# Patient Record
Sex: Male | Born: 1960
Health system: Southern US, Community
[De-identification: ages and names within clinical notes are randomized; demographics above are authoritative.]

## PROBLEM LIST (undated history)

## (undated) DIAGNOSIS — D649 Anemia, unspecified: Secondary | ICD-10-CM

## (undated) DIAGNOSIS — E876 Hypokalemia: Secondary | ICD-10-CM

## (undated) DIAGNOSIS — D560 Alpha thalassemia: Secondary | ICD-10-CM

## (undated) DIAGNOSIS — K529 Noninfective gastroenteritis and colitis, unspecified: Secondary | ICD-10-CM

## (undated) DIAGNOSIS — R0789 Other chest pain: Secondary | ICD-10-CM

## (undated) HISTORY — DX: Alpha thalassemia: D56.0

## (undated) HISTORY — DX: Other chest pain: R07.89

## (undated) HISTORY — DX: Anemia, unspecified: D64.9

## (undated) HISTORY — DX: Hypokalemia: E87.6

---

## 1998-05-23 ENCOUNTER — Encounter: Payer: Self-pay | Admitting: Emergency Medicine

## 1998-05-23 ENCOUNTER — Emergency Department (HOSPITAL_COMMUNITY): Admission: EM | Admit: 1998-05-23 | Discharge: 1998-05-23 | Payer: Self-pay | Admitting: Emergency Medicine

## 2010-05-01 ENCOUNTER — Inpatient Hospital Stay (HOSPITAL_COMMUNITY)
Admission: EM | Admit: 2010-05-01 | Discharge: 2010-05-04 | Payer: Self-pay | Source: Home / Self Care | Attending: Internal Medicine | Admitting: Internal Medicine

## 2010-05-02 ENCOUNTER — Encounter: Payer: Self-pay | Admitting: Internal Medicine

## 2010-05-10 ENCOUNTER — Ambulatory Visit: Payer: Self-pay | Admitting: Internal Medicine

## 2010-05-10 DIAGNOSIS — D509 Iron deficiency anemia, unspecified: Secondary | ICD-10-CM | POA: Insufficient documentation

## 2010-05-10 DIAGNOSIS — E876 Hypokalemia: Secondary | ICD-10-CM | POA: Insufficient documentation

## 2010-05-10 LAB — CONVERTED CEMR LAB
Basophils Absolute: 0 10*3/uL (ref 0.0–0.1)
Basophils Relative: 0 % (ref 0–1)
Calcium: 9.8 mg/dL (ref 8.4–10.5)
Eosinophils Absolute: 0 10*3/uL (ref 0.0–0.7)
Eosinophils Relative: 1 % (ref 0–5)
HCT: 41.5 % (ref 39.0–52.0)
MCV: 75.7 fL — ABNORMAL LOW (ref 78.0–100.0)
Monocytes Absolute: 0.4 10*3/uL (ref 0.1–1.0)
Neutrophils Relative %: 69 % (ref 43–77)
Potassium: 4.5 meq/L (ref 3.5–5.3)
RBC: 5.48 M/uL (ref 4.22–5.81)
WBC: 6 10*3/uL (ref 4.0–10.5)
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
## 2010-06-28 NOTE — Progress Notes (Signed)
Addended by: Dorie Rank on: 06/28/2010 02:56 PM   Modules accepted: Orders

## 2010-07-11 ENCOUNTER — Ambulatory Visit (INDEPENDENT_AMBULATORY_CARE_PROVIDER_SITE_OTHER): Payer: 59 | Admitting: Internal Medicine

## 2010-07-11 ENCOUNTER — Encounter: Payer: Self-pay | Admitting: Internal Medicine

## 2010-07-11 VITALS — BP 109/69 | HR 75 | Temp 97.0°F | Wt 120.1 lb

## 2010-07-11 DIAGNOSIS — D649 Anemia, unspecified: Secondary | ICD-10-CM

## 2010-07-11 DIAGNOSIS — E876 Hypokalemia: Secondary | ICD-10-CM

## 2010-07-11 DIAGNOSIS — Z0000 Encounter for general adult medical examination without abnormal findings: Secondary | ICD-10-CM | POA: Insufficient documentation

## 2010-07-11 DIAGNOSIS — E559 Vitamin D deficiency, unspecified: Secondary | ICD-10-CM | POA: Insufficient documentation

## 2010-07-11 DIAGNOSIS — R1031 Right lower quadrant pain: Secondary | ICD-10-CM

## 2010-07-11 LAB — URINALYSIS
Bilirubin Urine: NEGATIVE
Ketones, ur: NEGATIVE mg/dL
Protein, ur: NEGATIVE mg/dL
Specific Gravity, Urine: 1.017 (ref 1.005–1.030)
Urobilinogen, UA: 0.2 mg/dL (ref 0.0–1.0)

## 2010-07-11 LAB — COMPREHENSIVE METABOLIC PANEL
ALT: 19 U/L (ref 0–53)
Alkaline Phosphatase: 66 U/L (ref 39–117)
CO2: 27 mEq/L (ref 19–32)
Calcium: 9.4 mg/dL (ref 8.4–10.5)
Chloride: 103 mEq/L (ref 96–112)
Creat: 0.76 mg/dL (ref 0.40–1.50)
Sodium: 139 mEq/L (ref 135–145)

## 2010-07-11 MED ORDER — OMEPRAZOLE 20 MG PO CPDR: 20.0000 mg | DELAYED_RELEASE_CAPSULE | Freq: Every day | ORAL | Status: DC

## 2010-07-11 NOTE — Patient Instructions (Addendum)
Please come back to the clinic in 2-4 weeks if your stomach pain does not get better. Call the clinic at [phone] if you have any questions or concerns.

## 2010-07-12 ENCOUNTER — Encounter: Payer: Self-pay | Admitting: Internal Medicine

## 2010-07-12 LAB — CBC
HCT: 41.3 % (ref 39.0–52.0)
MCV: 79.4 fL (ref 78.0–100.0)
RBC: 5.2 MIL/uL (ref 4.22–5.81)
WBC: 4.6 10*3/uL (ref 4.0–10.5)

## 2010-07-12 NOTE — Progress Notes (Signed)
  Subjective:    Patient ID: Kyle Cooke, male    DOB: 05-19-1961, 50 y.o.   MRN: [id number]  HPI Patient is a 50 y/o man w/ h/o anemia (Hb constant spring trait), fairly new to the clinic, who returns on follow up.  Today he complains of right lower quadrant abdominal pain that suddenly started this am. He states it started out as a sharp pain, cannot identify any triggers, and has been off and on all day, however resolving. States he's never had any prior episodes. Denies associated fever, chills, sweats, n/v/d/constipation, melena, hematochezia, dysuria, or hematuria. He states that he occassionally gets bloated after meals.   Review of Systems As per HPI above     Objective:   Physical Exam  Constitutional: He is oriented to person, place, and time. He appears well-developed and well-nourished. No distress.  Cardiovascular: Normal rate, regular rhythm and normal heart sounds.  Exam reveals no gallop and no friction rub.   No murmur heard. Pulmonary/Chest: Effort normal and breath sounds normal. No respiratory distress. He has no wheezes. He has no rales.  Abdominal: Soft. Bowel sounds are normal.       Mildly tender to palpation on RLQ with voluntary guarding. No CVA tenderness.  Musculoskeletal: Normal range of motion.  Neurological: He is alert and oriented to person, place, and time.  Psychiatric: He has a normal mood and affect.          Assessment & Plan:

## 2010-07-12 NOTE — Assessment & Plan Note (Signed)
2/2 poor po intake during December 2011 admission. Resolved after admission.

## 2010-07-12 NOTE — Assessment & Plan Note (Signed)
History is quite vague, etiology unclear at this point. Reports the pain is resolving. Given location, appendicitis and nephrolithiasis are possible, although very unlikely in the absence of fever and hematuria. Also denies h/o constipation, no associated fever, nausea, vomiting or diarrhea. Does report bloating after meals.  Will check UA to assess for hematuria, CBC to assess for white count and CMP. Will also start him on a PPI if this turns out to be GERD related.  Reassess in 2 weeks. If at that point this is unresolved, consider getting an abdominal U/S.

## 2010-07-12 NOTE — Assessment & Plan Note (Signed)
2/2 poor diet and found during his December 2011 admission. Was started on oral supplements. Will recheck levels today and adjust dose as necessary.

## 2010-07-12 NOTE — Assessment & Plan Note (Signed)
Found to be anemic during December 2011 admission, 2/2 to Hemoglobin H constant spring variant per Hb electrophoresis. Last CBC checked on hospital followup in late December 2011 showed Hb of 12.9, which perhaps is around his baseline. No interval h/o fatigue, weakness, or lightheadedness. Will recheck CBC today. Also refer to GI for screening colonoscopy.

## 2010-07-12 NOTE — Assessment & Plan Note (Signed)
Will put in referral to GI for colonoscopy.

## 2010-07-23 ENCOUNTER — Ambulatory Visit: Payer: 59 | Admitting: Internal Medicine

## 2010-08-06 LAB — LIPID PANEL
HDL: 30 mg/dL — ABNORMAL LOW (ref >39)
LDL Cholesterol: 73 mg/dL (ref 0–99)
Total CHOL/HDL Ratio: 4.2 RATIO
VLDL: 23 mg/dL (ref 0–40)

## 2010-08-06 LAB — COMPREHENSIVE METABOLIC PANEL
ALT: 30 U/L (ref 0–53)
AST: 35 U/L (ref 0–37)
Albumin: 4.4 g/dL (ref 3.5–5.2)
Alkaline Phosphatase: 74 U/L (ref 39–117)
Creatinine, Ser: 0.85 mg/dL (ref 0.4–1.5)
GFR calc Af Amer: >60 mL/min (ref >60)
GFR calc non Af Amer: >60 mL/min (ref >60)
Potassium: 3.4 mEq/L — ABNORMAL LOW (ref 3.5–5.1)
Total Bilirubin: 1.9 mg/dL — ABNORMAL HIGH (ref 0.3–1.2)

## 2010-08-06 LAB — CBC
HCT: 34.4 % — ABNORMAL LOW (ref 39.0–52.0)
HCT: 35.4 % — ABNORMAL LOW (ref 39.0–52.0)
Hemoglobin: 10.8 g/dL — ABNORMAL LOW (ref 13.0–17.0)
Hemoglobin: 11.1 g/dL — ABNORMAL LOW (ref 13.0–17.0)
MCH: 22.9 pg — ABNORMAL LOW (ref 26.0–34.0)
MCH: 23 pg — ABNORMAL LOW (ref 26.0–34.0)
MCH: 23 pg — ABNORMAL LOW (ref 26.0–34.0)
MCH: 23.1 pg — ABNORMAL LOW (ref 26.0–34.0)
MCHC: 31.1 g/dL (ref 30.0–36.0)
MCHC: 31.1 g/dL (ref 30.0–36.0)
MCHC: 31.4 g/dL (ref 30.0–36.0)
MCV: 73.7 fL — ABNORMAL LOW (ref 78.0–100.0)
Platelets: 132 10*3/uL — ABNORMAL LOW (ref 150–400)
RBC: 4.8 MIL/uL (ref 4.22–5.81)
RDW: 15.4 % (ref 11.5–15.5)
RDW: 15.8 % — ABNORMAL HIGH (ref 11.5–15.5)
RDW: 15.8 % — ABNORMAL HIGH (ref 11.5–15.5)
WBC: 5.1 10*3/uL (ref 4.0–10.5)
WBC: 6.3 10*3/uL (ref 4.0–10.5)

## 2010-08-06 LAB — DIFFERENTIAL
Eosinophils Relative: 0 % (ref 0–5)
Lymphs Abs: 0.8 10*3/uL (ref 0.7–4.0)
Monocytes Absolute: 0.2 10*3/uL (ref 0.1–1.0)
Monocytes Relative: 4 % (ref 3–12)
Neutrophils Relative %: 83 % — ABNORMAL HIGH (ref 43–77)

## 2010-08-06 LAB — LEGIONELLA ANTIGEN, URINE

## 2010-08-06 LAB — CK: Total CK: 173 U/L (ref 7–232)

## 2010-08-06 LAB — RETICULOCYTES
RBC.: 4.83 MIL/uL (ref 4.22–5.81)
Retic Count, Absolute: 217.4 10*3/uL — ABNORMAL HIGH (ref 19.0–186.0)
Retic Ct Pct: 4.5 % — ABNORMAL HIGH (ref 0.4–3.1)

## 2010-08-06 LAB — VITAMIN B12: Vitamin B-12: 539 pg/mL (ref 211–911)

## 2010-08-06 LAB — BASIC METABOLIC PANEL
BUN: 6 mg/dL (ref 6–23)
BUN: 7 mg/dL (ref 6–23)
CO2: 24 mEq/L (ref 19–32)
CO2: 25 mEq/L (ref 19–32)
CO2: 26 mEq/L (ref 19–32)
Calcium: 8.7 mg/dL (ref 8.4–10.5)
Chloride: 108 mEq/L (ref 96–112)
Creatinine, Ser: 0.81 mg/dL (ref 0.4–1.5)
Creatinine, Ser: 0.83 mg/dL (ref 0.4–1.5)
GFR calc Af Amer: >60 mL/min (ref >60)
GFR calc non Af Amer: >60 mL/min (ref >60)
Glucose, Bld: 75 mg/dL (ref 70–99)
Glucose, Bld: 76 mg/dL (ref 70–99)
Potassium: 3.3 mEq/L — ABNORMAL LOW (ref 3.5–5.1)
Potassium: 3.6 mEq/L (ref 3.5–5.1)
Sodium: 139 mEq/L (ref 135–145)
Sodium: 141 mEq/L (ref 135–145)

## 2010-08-06 LAB — HEPATIC FUNCTION PANEL
AST: 36 U/L (ref 0–37)
Albumin: 3.9 g/dL (ref 3.5–5.2)
Alkaline Phosphatase: 60 U/L (ref 39–117)
Bilirubin, Direct: 0.2 mg/dL (ref 0.0–0.3)
Total Bilirubin: 2.1 mg/dL — ABNORMAL HIGH (ref 0.3–1.2)

## 2010-08-06 LAB — HGB ELECTROPHORESIS REFLEXED REPORT
Hemoglobin A - HGBRFX: 94.8 % — ABNORMAL LOW (ref >96.0)
Hemoglobin A2 - HGBRFX: 1 % — ABNORMAL LOW (ref 1.8–3.5)
Hemoglobin F - HGBRFX: 1.7 % (ref <2.0)
Sickle Solubility Test - HGBRFX: UNDETERMINED

## 2010-08-06 LAB — RAPID URINE DRUG SCREEN, HOSP PERFORMED
Barbiturates: NOT DETECTED
Benzodiazepines: NOT DETECTED
Tetrahydrocannabinol: NOT DETECTED

## 2010-08-06 LAB — IRON AND TIBC
Iron: 140 ug/dL — ABNORMAL HIGH (ref 42–135)
TIBC: 216 ug/dL (ref 215–435)

## 2010-08-06 LAB — URINALYSIS, ROUTINE W REFLEX MICROSCOPIC
Glucose, UA: NEGATIVE mg/dL
Hgb urine dipstick: NEGATIVE
Ketones, ur: NEGATIVE mg/dL
Urobilinogen, UA: 0.2 mg/dL (ref 0.0–1.0)
pH: 7 (ref 5.0–8.0)

## 2010-08-06 LAB — HEMOGLOBINOPATHY EVALUATION
Hemoglobin Other: 4.7 % — ABNORMAL HIGH (ref 0.0–0.0)
Hgb A2 Quant: 1.5 % — ABNORMAL LOW (ref 2.2–3.2)
Hgb A: 92.1 % — ABNORMAL LOW (ref 96.8–97.8)

## 2010-08-06 LAB — CK TOTAL AND CKMB (NOT AT ARMC): Total CK: 215 U/L (ref 7–232)

## 2010-08-06 LAB — HEPATITIS PANEL, ACUTE
HCV Ab: NEGATIVE
HCV Ab: NEGATIVE
Hep A IgM: NEGATIVE
Hep A IgM: NEGATIVE
Hep B C IgM: NEGATIVE

## 2010-08-06 LAB — PROTIME-INR: INR: 1.06 (ref 0.00–1.49)

## 2010-08-06 LAB — LACTATE DEHYDROGENASE: LDH: 117 U/L (ref 94–250)

## 2010-08-06 LAB — POCT CARDIAC MARKERS

## 2010-08-06 LAB — EPSTEIN-BARR VIRUS VCA ANTIBODY PANEL
EBV EA IgG: 0.88 {ISR}
EBV VCA IgG: 2.81 {ISR} — ABNORMAL HIGH

## 2010-08-06 LAB — HEAVY METALS, BLOOD: Lead: <2 ug/dL (ref <10)

## 2010-08-06 LAB — TROPONIN I: Troponin I: 0.01 ng/mL (ref 0.00–0.06)

## 2010-08-06 LAB — ETHANOL: Alcohol, Ethyl (B): <5 mg/dL (ref 0–10)

## 2010-08-06 LAB — SEDIMENTATION RATE: Sed Rate: 0 mm/hr (ref 0–16)

## 2010-08-06 LAB — HAPTOGLOBIN: Haptoglobin: 15 mg/dL — ABNORMAL LOW (ref 16–200)

## 2010-08-06 LAB — APTT: aPTT: 30 seconds (ref 24–37)

## 2010-12-06 ENCOUNTER — Encounter: Payer: Self-pay | Admitting: Internal Medicine

## 2012-02-12 ENCOUNTER — Other Ambulatory Visit: Payer: Self-pay | Admitting: Internal Medicine

## 2012-07-10 ENCOUNTER — Ambulatory Visit (INDEPENDENT_AMBULATORY_CARE_PROVIDER_SITE_OTHER): Payer: 59 | Admitting: Family Medicine

## 2012-07-10 VITALS — BP 144/82 | HR 74 | Temp 98.1°F | Resp 18 | Ht 60.0 in | Wt 118.2 lb

## 2012-07-10 DIAGNOSIS — H109 Unspecified conjunctivitis: Secondary | ICD-10-CM

## 2012-07-10 DIAGNOSIS — H5712 Ocular pain, left eye: Secondary | ICD-10-CM

## 2012-07-10 DIAGNOSIS — H5710 Ocular pain, unspecified eye: Secondary | ICD-10-CM

## 2012-07-10 MED ORDER — OFLOXACIN 0.3 % OP SOLN: 2.0000 [drp] | Freq: Four times a day (QID) | OPHTHALMIC | Status: DC

## 2012-07-10 NOTE — Progress Notes (Signed)
[facility]:  Office Visit  Chief Complaint:  Chief Complaint  Patient presents with  . Eye Problem    x 1 year  left eye    HPI: Kyle Cooke is a 52 y.o. male who complains of  Left eye problem x 1 year. About 3 days ago he started having some irritation, pain behind eyes and now it is red and there is some bleeding. 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 No photophobia, no vision loss.   He had an operation on his left eye on [facility]. He does not know who his doctor is.   Past Medical History  Diagnosis Date  . Hypokalemia   . Atypical chest pain      Noncardiac in etiology with negative cardiac enzymes 2-D echo and EKG during December 2011 admission  . Anemia   . Hemoglobin H constant spring variant   . Vitamin D deficiency    No past surgical history on file. History   Social History  . Marital Status: Married    Spouse Name: N/A    Number of Children: N/A  . Years of Education: N/A   Occupational History  . employed    Social History Main Topics  . Smoking status: Former Smoker    Quit date: 07/11/1996  . Smokeless tobacco: None  . Alcohol Use: No  . Drug Use: No  . Sexually Active: None   Other Topics Concern  . None   Social History Narrative   Pt. is from [place]; speaks some [name]. Currently employed. Denies smoking, alcohol or illicit drug use.Smoking Status:  never   No family history on file. No Known Allergies Prior to Admission medications   Medication Sig Start Date End Date Taking? Authorizing Provider  aspirin 81 MG chewable tablet Chew 81 mg by mouth daily.      Historical Provider, MD  b complex vitamins tablet Take 1 tablet by mouth daily.      Historical Provider, MD  ergocalciferol (VITAMIN D2) 50000 UNIT capsule Take 50,000 Units by mouth once a week.      Historical Provider, MD     ROS: The patient denies fevers, chills, night sweats, unintentional weight loss, chest pain, palpitations, wheezing, dyspnea on exertion, nausea, vomiting,  abdominal pain, dysuria, hematuria, melena, numbness, weakness, or tingling.   All other systems have been reviewed and were otherwise negative with the exception of those mentioned in the HPI and as above.    PHYSICAL EXAM: Filed Vitals:   07/10/12 0958  BP: 144/82  Pulse: 74  Temp: 98.1 F (36.7 C)  Resp: 18   Filed Vitals:   07/10/12 0958  Height: 5' (1.524 m)  Weight: 118 lb 3.2 oz (53.615 kg)   Body mass index is 23.08 kg/(m^2).  General: Alert, no acute distress HEENT:  Normocephalic, atraumatic, oropharynx patent. EOMI. PERRLA. Fundoscopic exam nl. + left eye has medial sceleral icterus. + fluoroscein exam has mild uptake on left medial sclera. No cellulitis.  Cardiovascular:  Regular rate and rhythm, no rubs murmurs or gallops.  No Carotid bruits, radial pulse intact. No pedal edema.  Respiratory: Clear to auscultation bilaterally.  No wheezes, rales, or rhonchi.  No cyanosis, no use of accessory musculature GI: No organomegaly, abdomen is soft and non-tender, positive bowel sounds.  No masses. Skin: No rashes. Neurologic: Facial musculature symmetric. Psychiatric: Patient is appropriate throughout our interaction. Lymphatic: No cervical lymphadenopathy Musculoskeletal: Gait intact.   LABS: Results for orders placed in visit on 07/11/10  COMPREHENSIVE METABOLIC PANEL      Result Value Range   Sodium 139  135 - 145 mEq/L   Potassium 4.0  3.5 - 5.3 mEq/L   Chloride 103  96 - 112 mEq/L   CO2 27  19 - 32 mEq/L   Glucose, Bld 83  70 - 99 mg/dL   BUN 14  6 - 23 mg/dL   Creat 1.61  0.96 - 0.45 mg/dL   Total Bilirubin 1.9 (*) 0.3 - 1.2 mg/dL   Alkaline Phosphatase 66  39 - 117 U/L   AST 23  0 - 37 U/L   ALT 19  0 - 53 U/L   Total Protein 7.3  6.0 - 8.3 g/dL   Albumin 4.5  3.5 - 5.2 g/dL   Calcium 9.4  8.4 - 40.9 mg/dL  CBC      Result Value Range   WBC 4.6  4.0 - 10.5 K/uL   RBC 5.20  4.22 - 5.81 MIL/uL   Hemoglobin 12.1 (*) 13.0 - 17.0 g/dL   HCT 81.1  91.4 -  78.2 %   MCV 79.4  78.0 - 100.0 fL   MCH 23.3 (*) 26.0 - 34.0 pg   MCHC 29.3 (*) 30.0 - 36.0 g/dL   RDW 95.6 (*) 21.3 - 08.6 %   Platelets 168  150 - 400 K/uL  URINALYSIS      Result Value Range   Color, Urine YELLOW  YELLOW   APPearance CLEAR  CLEAR   Specific Gravity, Urine 1.017  1.005 - 1.030   pH 7.0  5.0 - 8.0   Urine Glucose, Fasting NEG  NEG mg/dL   Bilirubin Urine NEG  NEG   Ketones, ur NEG  NEG mg/dL   Hgb urine dipstick NEG  NEG   Protein, ur NEG  NEG mg/dL   Urobilinogen, UA 0.2  0.0 - 1.0 mg/dL   Nitrite NEG  NEG   Leukocytes, UA NEG  NEG  VITAMIN D 1,25 DIHYDROXY      Result Value Range   Vitamin D 1, 25 (OH) Total 62  18 - 72 pg/mL   Vitamin D3 1, 25 (OH) 11     Vitamin D2 1, 25 (OH) 51       EKG/XRAY:   Primary read interpreted by Dr. Conley Rolls at [facility].   ASSESSMENT/PLAN: Encounter Diagnosis  Name Primary?  . Eye pain, left Yes   52 y/o [place] male with a h/o left eye pain x 1 year s/p eye surgery by ? MD on [address]. He is here with an acute case of left eye irritation, pain, and conjunctival erythema for the last 3 days. 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  Language barrier.Fluoriscein  exam has sclight sceral uptake , no corneal ulcers .  F/u in the AM Rx Ocuflox.    Shelby Anderle PHUONG, DO 07/10/2012 10:46 AM

## 2012-07-11 ENCOUNTER — Ambulatory Visit (INDEPENDENT_AMBULATORY_CARE_PROVIDER_SITE_OTHER): Payer: 59 | Admitting: Family Medicine

## 2012-07-11 VITALS — BP 150/70 | HR 90 | Temp 98.6°F | Resp 12 | Ht 61.0 in | Wt 119.0 lb

## 2012-07-11 DIAGNOSIS — H189 Unspecified disorder of cornea: Secondary | ICD-10-CM

## 2012-07-11 DIAGNOSIS — S0500XA Injury of conjunctiva and corneal abrasion without foreign body, unspecified eye, initial encounter: Secondary | ICD-10-CM

## 2012-07-11 DIAGNOSIS — S0510XA Contusion of eyeball and orbital tissues, unspecified eye, initial encounter: Secondary | ICD-10-CM

## 2012-07-11 NOTE — Progress Notes (Signed)
[facility]:  Office Visit  Chief Complaint:  Chief Complaint  Patient presents with  . Eye Pain    HPI: Kyle Cooke is a 52 y.o. male who is here for a recheck of his eye. There is a language barrier. He is here with his son who speaks [name] but he is not helpful in translating either.  He was evaluated by me for left eye redness and pain yesterday, from what he told me and what I was able to understand he was at home and started having pain in his left eye and also redness. NKI. However he also states that he has had this problem off and on for about 1 year. 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 While in the office he called his supervisor Kyle Cooke. Kyle Cooke tells me that his eye issues has been going on for several months, and she told him to go home on Saturday morning and see a doctor because he was having minimal blood draining from his left eye. So he came into the office, I did a fluoroscein  exam, there was a small uptake at the left  medial sclera that appears to be an abrasion where I believe he also has a pterygium. No corneal ulcers were seen. Today he is better but not 100%. He sees [name]. He has had an operation on his left eye by [name] but he does not know what it is. Denies vision changes, photophobia, worsening pain.   Past Medical History  Diagnosis Date  . Hypokalemia   . Atypical chest pain      Noncardiac in etiology with negative cardiac enzymes 2-D echo and EKG during December 2011 admission  . Anemia   . Hemoglobin H constant spring variant   . Vitamin D deficiency    No past surgical history on file. History   Social History  . Marital Status: Married    Spouse Name: N/A    Number of Children: N/A  . Years of Education: N/A   Occupational History  . employed    Social History Main Topics  . Smoking status: Former Smoker    Quit date: 07/11/1996  . Smokeless tobacco: None  .  Alcohol Use: No  . Drug Use: No  . Sexually Active: None   Other Topics Concern  . None   Social History Narrative   Pt. is from [place]; speaks some [name]. Currently employed. Denies smoking, alcohol or illicit drug use.Smoking Status:  never   No family history on file. No Known Allergies Prior to Admission medications   Medication Sig Start Date End Date Taking? Authorizing Provider  ofloxacin (OCUFLOX) 0.3 % ophthalmic solution Place 2 drops into the left eye 4 (four) times daily. 07/10/12  Yes [name], DO  aspirin 81 MG chewable tablet Chew 81 mg by mouth daily.      Historical Provider, MD  b complex vitamins tablet Take 1 tablet by mouth daily.      Historical Provider, MD  ergocalciferol (VITAMIN D2) 50000 UNIT capsule Take 50,000 Units by mouth once a week.      Historical Provider, MD     ROS: The patient denies fevers, chills, night sweats, unintentional weight loss, chest pain, palpitations, wheezing, dyspnea on exertion, nausea, vomiting, abdominal pain, dysuria, hematuria, melena, numbness, weakness, or tingling.  All other systems have been reviewed and were otherwise negative with the exception of those mentioned  in the HPI and as above.    PHYSICAL EXAM: Filed Vitals:   07/11/12 1517  BP: 150/70  Pulse: 90  Temp: 98.6 F (37 C)  Resp: 12   Filed Vitals:   07/11/12 1517  Height: 5\' 1"  (1.549 m)  Weight: 119 lb (53.978 kg)   Body mass index is 22.5 kg/(m^2).  General: Alert, no acute distress HEENT:  Normocephalic, atraumatic, oropharynx patent. EOMI, PERRLA. Still red conjunctiva, + pterygium left eye Cardiovascular:  Regular rate and rhythm, no rubs murmurs or gallops.  No Carotid bruits, radial pulse intact. No pedal edema.  Respiratory: Clear to auscultation bilaterally.  No wheezes, rales, or rhonchi.  No cyanosis, no use of accessory musculature GI: No organomegaly, abdomen is soft and non-tender, positive bowel sounds.  No masses. Skin: No  rashes. Neurologic: Facial musculature symmetric. Psychiatric: Patient is appropriate throughout our interaction. Lymphatic: No cervical lymphadenopathy Musculoskeletal: Gait intact.   LABS:    EKG/XRAY:   Primary read interpreted by Dr. Conley Rolls at [facility].   ASSESSMENT/PLAN: Encounter Diagnoses  Name Primary?  . Corneal abnormality Yes  . Corneal abrasion    Will refer to [name] since this has been a chronic problem and he has had an unknown eye surgery ins ame eye? Cataracts Can go back to work with eye protection C/w abx eye drops F/u prn   [name], DO 07/12/2012 5:15 PM

## 2012-12-01 ENCOUNTER — Encounter (HOSPITAL_COMMUNITY): Payer: Self-pay | Admitting: Cardiology

## 2012-12-01 ENCOUNTER — Encounter (HOSPITAL_COMMUNITY): Payer: Self-pay | Admitting: *Deleted

## 2012-12-01 ENCOUNTER — Emergency Department (HOSPITAL_COMMUNITY)
Admission: EM | Admit: 2012-12-01 | Discharge: 2012-12-01 | Disposition: A | Discharge status: Home / Self Care | Payer: 59 | Attending: Emergency Medicine | Admitting: Emergency Medicine

## 2012-12-01 ENCOUNTER — Emergency Department (HOSPITAL_COMMUNITY)
Admission: EM | Admit: 2012-12-01 | Discharge: 2012-12-01 | Disposition: A | Discharge status: Nursing Home (Includes ICF) | Payer: 59 | Source: Home / Self Care | Attending: Family Medicine | Admitting: Family Medicine

## 2012-12-01 DIAGNOSIS — Z87891 Personal history of nicotine dependence: Secondary | ICD-10-CM | POA: Insufficient documentation

## 2012-12-01 DIAGNOSIS — Y929 Unspecified place or not applicable: Secondary | ICD-10-CM | POA: Insufficient documentation

## 2012-12-01 DIAGNOSIS — T2020XA Burn of second degree of head, face, and neck, unspecified site, initial encounter: Secondary | ICD-10-CM | POA: Insufficient documentation

## 2012-12-01 DIAGNOSIS — E559 Vitamin D deficiency, unspecified: Secondary | ICD-10-CM | POA: Insufficient documentation

## 2012-12-01 DIAGNOSIS — Y939 Activity, unspecified: Secondary | ICD-10-CM | POA: Insufficient documentation

## 2012-12-01 DIAGNOSIS — Z79899 Other long term (current) drug therapy: Secondary | ICD-10-CM | POA: Insufficient documentation

## 2012-12-01 DIAGNOSIS — T310 Burns involving less than 10% of body surface: Secondary | ICD-10-CM

## 2012-12-01 DIAGNOSIS — Z8639 Personal history of other endocrine, nutritional and metabolic disease: Secondary | ICD-10-CM | POA: Insufficient documentation

## 2012-12-01 DIAGNOSIS — X12XXXA Contact with other hot fluids, initial encounter: Secondary | ICD-10-CM | POA: Insufficient documentation

## 2012-12-01 DIAGNOSIS — Z862 Personal history of diseases of the blood and blood-forming organs and certain disorders involving the immune mechanism: Secondary | ICD-10-CM | POA: Insufficient documentation

## 2012-12-01 MED ORDER — HYDROMORPHONE HCL PF 1 MG/ML IJ SOLN
INTRAMUSCULAR | Status: AC
  Filled 2012-12-01: qty 1

## 2012-12-01 MED ORDER — TETANUS-DIPHTH-ACELL PERTUSSIS 5-2.5-18.5 LF-MCG/0.5 IM SUSP
0.5000 mL | Freq: Once | INTRAMUSCULAR | Status: AC
  Administered 2012-12-01: 0.5 mL via INTRAMUSCULAR

## 2012-12-01 MED ORDER — SILVER SULFADIAZINE 1 % EX CREA
TOPICAL_CREAM | Freq: Once | CUTANEOUS | Status: AC
  Administered 2012-12-01: 20:00:00 via TOPICAL
  Filled 2012-12-01: qty 85

## 2012-12-01 MED ORDER — TETANUS-DIPHTH-ACELL PERTUSSIS 5-2.5-18.5 LF-MCG/0.5 IM SUSP
INTRAMUSCULAR | Status: AC
  Filled 2012-12-01: qty 0.5

## 2012-12-01 MED ORDER — HYDROCODONE-ACETAMINOPHEN 5-325 MG PO TABS: 1.0000 | ORAL_TABLET | Freq: Four times a day (QID) | ORAL | Status: DC | PRN

## 2012-12-01 MED ORDER — SODIUM CHLORIDE 0.9 % IV SOLN
Freq: Once | INTRAVENOUS | Status: AC
  Administered 2012-12-01: 18:00:00 via INTRAVENOUS

## 2012-12-01 MED ORDER — HYDROCODONE-ACETAMINOPHEN 5-325 MG PO TABS
1.0000 | ORAL_TABLET | Freq: Once | ORAL | Status: AC
  Administered 2012-12-01: 1 via ORAL
  Filled 2012-12-01: qty 1

## 2012-12-01 MED ORDER — HYDROMORPHONE HCL 1 MG/ML IJ SOLN
1.0000 mg | Freq: Once | INTRAMUSCULAR | Status: AC
  Administered 2012-12-01: 1 mg via INTRAVENOUS
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
## 2012-12-01 NOTE — ED Notes (Signed)
Pt was given 1 mg of dilaudid at urgent care center.
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
## 2012-12-01 NOTE — ED Notes (Signed)
[facility] here and at the pt's BS at this time receiving report.
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
## 2012-12-30 ENCOUNTER — Encounter (HOSPITAL_COMMUNITY): Payer: Self-pay | Admitting: *Deleted

## 2012-12-30 ENCOUNTER — Emergency Department (HOSPITAL_COMMUNITY): Payer: 59

## 2012-12-30 ENCOUNTER — Emergency Department (HOSPITAL_COMMUNITY)
Admission: EM | Admit: 2012-12-30 | Discharge: 2012-12-30 | Disposition: A | Discharge status: Home / Self Care | Payer: 59 | Attending: Emergency Medicine | Admitting: Emergency Medicine

## 2012-12-30 DIAGNOSIS — Z862 Personal history of diseases of the blood and blood-forming organs and certain disorders involving the immune mechanism: Secondary | ICD-10-CM | POA: Insufficient documentation

## 2012-12-30 DIAGNOSIS — R51 Headache: Secondary | ICD-10-CM | POA: Insufficient documentation

## 2012-12-30 DIAGNOSIS — R109 Unspecified abdominal pain: Secondary | ICD-10-CM | POA: Insufficient documentation

## 2012-12-30 DIAGNOSIS — K219 Gastro-esophageal reflux disease without esophagitis: Secondary | ICD-10-CM | POA: Insufficient documentation

## 2012-12-30 DIAGNOSIS — M549 Dorsalgia, unspecified: Secondary | ICD-10-CM | POA: Insufficient documentation

## 2012-12-30 DIAGNOSIS — Z8639 Personal history of other endocrine, nutritional and metabolic disease: Secondary | ICD-10-CM | POA: Insufficient documentation

## 2012-12-30 DIAGNOSIS — R42 Dizziness and giddiness: Secondary | ICD-10-CM | POA: Insufficient documentation

## 2012-12-30 DIAGNOSIS — R110 Nausea: Secondary | ICD-10-CM | POA: Insufficient documentation

## 2012-12-30 DIAGNOSIS — Z8679 Personal history of other diseases of the circulatory system: Secondary | ICD-10-CM | POA: Insufficient documentation

## 2012-12-30 DIAGNOSIS — Z87891 Personal history of nicotine dependence: Secondary | ICD-10-CM | POA: Insufficient documentation

## 2012-12-30 LAB — CBC WITH DIFFERENTIAL/PLATELET
Eosinophils Absolute: 0 10*3/uL (ref 0.0–0.7)
Eosinophils Relative: 0 % (ref 0–5)
Hemoglobin: 12.3 g/dL — ABNORMAL LOW (ref 13.0–17.0)
Lymphs Abs: 0.8 10*3/uL (ref 0.7–4.0)
MCH: 23.5 pg — ABNORMAL LOW (ref 26.0–34.0)
MCHC: 31.9 g/dL (ref 30.0–36.0)
MCV: 73.6 fL — ABNORMAL LOW (ref 78.0–100.0)
Monocytes Relative: 6 % (ref 3–12)
RBC: 5.23 MIL/uL (ref 4.22–5.81)

## 2012-12-30 LAB — URINALYSIS, ROUTINE W REFLEX MICROSCOPIC
Bilirubin Urine: NEGATIVE
Hgb urine dipstick: NEGATIVE
Ketones, ur: NEGATIVE mg/dL
Specific Gravity, Urine: 1.023 (ref 1.005–1.030)
Urobilinogen, UA: 0.2 mg/dL (ref 0.0–1.0)
pH: 5.5 (ref 5.0–8.0)

## 2012-12-30 LAB — COMPREHENSIVE METABOLIC PANEL
ALT: 20 U/L (ref 0–53)
BUN: 17 mg/dL (ref 6–23)
CO2: 27 mEq/L (ref 19–32)
Calcium: 9.4 mg/dL (ref 8.4–10.5)
Creatinine, Ser: 0.9 mg/dL (ref 0.50–1.35)
GFR calc Af Amer: >90 mL/min (ref >90)
GFR calc non Af Amer: >90 mL/min (ref >90)
Glucose, Bld: 89 mg/dL (ref 70–99)

## 2012-12-30 MED ORDER — PANTOPRAZOLE SODIUM 20 MG PO TBEC: 20.0000 mg | DELAYED_RELEASE_TABLET | Freq: Every day | ORAL | Status: DC

## 2012-12-30 MED ORDER — MORPHINE SULFATE 4 MG/ML IJ SOLN
4.0000 mg | Freq: Once | INTRAMUSCULAR | Status: AC
  Administered 2012-12-30: 4 mg via INTRAVENOUS
  Filled 2012-12-30: qty 1

## 2012-12-30 MED ORDER — ONDANSETRON HCL 4 MG/2ML IJ SOLN
4.0000 mg | Freq: Once | INTRAMUSCULAR | Status: AC
  Administered 2012-12-30: 4 mg via INTRAVENOUS
  Filled 2012-12-30: qty 2

## 2012-12-30 MED ORDER — SODIUM CHLORIDE 0.9 % IV BOLUS (SEPSIS)
1000.0000 mL | Freq: Once | INTRAVENOUS | Status: AC
  Administered 2012-12-30: 1000 mL via INTRAVENOUS

## 2012-12-30 NOTE — ED Notes (Signed)
Patient transported to X-ray 

## 2012-12-30 NOTE — ED Notes (Addendum)
Reports increased pain after eating or drinking. States has been eating & drinking normal. Pain has been constant since onset

## 2012-12-30 NOTE — ED Notes (Addendum)
C/o upper abd pain radiating through to back since Tuesday. Denies n/v/d.

## 2012-12-30 NOTE — ED Notes (Signed)
Pt undressed and in gown, pt placed on continuous pulse oximetry and blood pressure cuff; family at bedside

## 2012-12-30 NOTE — ED Provider Notes (Signed)
CSN: [id number]     Arrival date & time 12/30/12  6962 History     First MD Initiated Contact with Patient 12/30/12 [phone]     Chief Complaint  Patient presents with  . Abdominal Pain  . Back pain . Headaches. (Consider location/radiation/quality/duration/timing/severity/associated sxs/prior Treatment) HPI 52 y o [place] male, with hx of anemia (Hb constant spring trait), vit D deficiency. Presented with C/o of Abdominal pain of 2 day duration, mostly epigastric region, but also on the right side of his abdomen,9/10 intensity, descibed as hunger pangs, and knives going thro his abd, Radiates to his back, aggrav by food and lying back, says the sensations goes upwards into his chest, no relieving factors. Has nausea but no vomiting, no change in bowel habits or appetite, no hematochezia, no weight loss, no previous epidoses of abd pain, no dysuria or freq, no fever. Pt also complains of back pain, but no weakness or shooting pains to his legs, no tingling sensation, no hx of falls or trauma, pt denies alcohol intake, use of drugs or smoking cig. Headaches started this  Morning, generalized, with assoc dizziness.      Past Medical History  Diagnosis Date  . Hypokalemia   . Atypical chest pain      Noncardiac in etiology with negative cardiac enzymes 2-D echo and EKG during December 2011 admission  . Anemia   . Hemoglobin H constant spring variant   . Vitamin D deficiency    History reviewed. No pertinent past surgical history. No family history on file. History  Substance Use Topics  . Smoking status: Former Smoker    Quit date: 07/11/1996  . Smokeless tobacco: Not on file  . Alcohol Use: No    Review of Systems No pertinent findings on ROS.  Allergies  Review of patient's allergies indicates no known allergies.  Home Medications   Current Outpatient Rx  Name  Route  Sig  Dispense  Refill  . pantoprazole (PROTONIX) 20 MG tablet   Oral   Take 1 tablet (20 mg total) by  mouth daily.   60 tablet   0    BP 105/70  Pulse 58  Temp(Src) 98.1 F (36.7 C) (Oral)  Resp 17  SpO2 99% Physical Exam Gen- alert, co- op, not in any obvious distress, a bit of a challenge understanding px, Niece present to help with interpretation. HEENT- Atraumatic, normocephalic, PERRL, EOMI,, Sclera appears clear, mucosa- moist, no ulcers.. Cardiac- RRR, no murmurs, rubs or gallops. Resp- Clear to auscultation bilat. Abd- Soft, moves with respiration, tenderness RUQ, , bowel sounds present, no masses or organomegaly. Back- normal spinal curvature,tenderness in thoracic region of spine. Extr- 2+ symm pulses in extremities, normal strenght in all extrem.   ED Course   Procedures (including critical care time)  Labs Reviewed  URINALYSIS, ROUTINE W REFLEX MICROSCOPIC - Abnormal; Notable for the following:    Color, Urine AMBER (*)    All other components within normal limits  CBC WITH DIFFERENTIAL - Abnormal; Notable for the following:    Hemoglobin 12.3 (*)    HCT 38.5 (*)    MCV 73.6 (*)    MCH 23.5 (*)    RDW 16.0 (*)    Neutrophils Relative % 78 (*)    All other components within normal limits  COMPREHENSIVE METABOLIC PANEL - Abnormal; Notable for the following:    Total Bilirubin 2.0 (*)    All other components within normal limits  LIPASE, BLOOD   US Abdomen Complete  12/30/2012   *RADIOLOGY REPORT*  Abdominal ultrasound  History:  Abdominal pain  Findings:  Gallbladder is visualized in multiple projections. There are no gallstones, gallbladder wall thickening, or pericholecystic fluid collection.  There is no intrahepatic, common hepatic, or common bile duct dilatation.  Pancreas appears normal.  No focal liver lesions are identified.  Spleen is borderline prominent measuring 13 cm in length.  Splenic echotexture is normal.  There is a complex predominantly cystic mass arising from the lower pole left kidney measuring 1.7 x 1.4 x 1.3 cm in size.  Kidneys bilaterally  otherwise appear within normal limits.  There is no ascites.  Aorta is nonaneurysmal.  Inferior vena cava appears normal.  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  This finding warrants a followup ultrasound in approximately 3 months to assess for stability.  Spleen borderline prominent.  No gallbladder pathology.  Study otherwise unremarkable.   Original Report Authenticated By: Bretta Bang, M.D.   Diagnosis- - Abdominal pain, likely due to GERD.  MDM  Abdominal Pain, likely due to GERD, but R/o other more severe causes-  - Pancreatitis- considering radiating pain to the back,. Lipase- was normal. -- Cholecystitis- Px has a hemolytic dx, increasing risk of gallstone formation - LFTs- WNL- cept for Bil- - BMP-WNL. - CBC-WNL, except for mild anemia, expected considering px hx. - Urinalysis- WNL. - IV n/s 1L. - IV morpine- 4mg  stat. - Ondansetron- 4mg  stat. - Orthostatic vitals, as px complained of some dizziness- Standing-117/80, PR-63, sitting-119/75, PR 60, with no SOB or complaints of dizziness. - Abd [place]- complete- Only findings- Bosniak IIF cystic mass arising from lower pole left  kidney measuring 1.7 x 1.4 x 1.3 cm. This finding warrants a followup ultrasound in approximately 3 months to assess for stability. - D/c home on Pantoprazole- 20mg  daily for 8 weeks and counselled on findings on USS and to follow up with PCP, appointment within 2 weeks, pt verbalized understanding.   Kennis Carina, MD 12/30/12 1906

## 2012-12-31 NOTE — ED Provider Notes (Signed)
I saw and evaluated the patient, reviewed the resident's note and I agree with the findings and plan.  Agree with EKG interpretation if present.   Pt with epigastric pain and RUQ pain occasionally burning in chest. Labs and imaging unremarkable. Likely GERD. PPI and PCP followup.   Paton Crum B. Bernette Mayers, MD 12/31/12 [phone]

## 2013-10-27 ENCOUNTER — Emergency Department (HOSPITAL_COMMUNITY): Payer: 59

## 2013-10-27 ENCOUNTER — Encounter (HOSPITAL_COMMUNITY): Payer: Self-pay | Admitting: Emergency Medicine

## 2013-10-27 ENCOUNTER — Emergency Department (HOSPITAL_COMMUNITY)
Admission: EM | Admit: 2013-10-27 | Discharge: 2013-10-27 | Disposition: A | Discharge status: Home / Self Care | Payer: 59 | Attending: Emergency Medicine | Admitting: Emergency Medicine

## 2013-10-27 DIAGNOSIS — E876 Hypokalemia: Secondary | ICD-10-CM | POA: Insufficient documentation

## 2013-10-27 DIAGNOSIS — R0789 Other chest pain: Secondary | ICD-10-CM | POA: Insufficient documentation

## 2013-10-27 DIAGNOSIS — D560 Alpha thalassemia: Secondary | ICD-10-CM | POA: Insufficient documentation

## 2013-10-27 DIAGNOSIS — Z79899 Other long term (current) drug therapy: Secondary | ICD-10-CM | POA: Insufficient documentation

## 2013-10-27 DIAGNOSIS — Z87891 Personal history of nicotine dependence: Secondary | ICD-10-CM | POA: Insufficient documentation

## 2013-10-27 DIAGNOSIS — E559 Vitamin D deficiency, unspecified: Secondary | ICD-10-CM | POA: Insufficient documentation

## 2013-10-27 DIAGNOSIS — D649 Anemia, unspecified: Secondary | ICD-10-CM

## 2013-10-27 LAB — CBC WITH DIFFERENTIAL/PLATELET
BASOS ABS: 0 10*3/uL (ref 0.0–0.1)
Basophils Relative: 0 % (ref 0–1)
EOS PCT: 1 % (ref 0–5)
Eosinophils Absolute: 0.1 10*3/uL (ref 0.0–0.7)
HEMATOCRIT: 34.9 % — AB (ref 39.0–52.0)
Hemoglobin: 10.8 g/dL — ABNORMAL LOW (ref 13.0–17.0)
LYMPHS ABS: 0.9 10*3/uL (ref 0.7–4.0)
Lymphocytes Relative: 15 % (ref 12–46)
MCH: 22.7 pg — AB (ref 26.0–34.0)
MCHC: 30.9 g/dL (ref 30.0–36.0)
MCV: 73.3 fL — AB (ref 78.0–100.0)
MONO ABS: 0.4 10*3/uL (ref 0.1–1.0)
Monocytes Relative: 6 % (ref 3–12)
NEUTROS ABS: 4.9 10*3/uL (ref 1.7–7.7)
Neutrophils Relative %: 78 % — ABNORMAL HIGH (ref 43–77)
PLATELETS: 166 10*3/uL (ref 150–400)
RBC: 4.76 MIL/uL (ref 4.22–5.81)
RDW: 15.7 % — AB (ref 11.5–15.5)
WBC: 6.3 10*3/uL (ref 4.0–10.5)

## 2013-10-27 LAB — URINALYSIS, ROUTINE W REFLEX MICROSCOPIC
Bilirubin Urine: NEGATIVE
Glucose, UA: NEGATIVE mg/dL
Hgb urine dipstick: NEGATIVE
Ketones, ur: NEGATIVE mg/dL
Leukocytes, UA: NEGATIVE
NITRITE: NEGATIVE
Protein, ur: NEGATIVE mg/dL
SPECIFIC GRAVITY, URINE: 1.012 (ref 1.005–1.030)
UROBILINOGEN UA: 1 mg/dL (ref 0.0–1.0)
pH: 7.5 (ref 5.0–8.0)

## 2013-10-27 LAB — COMPREHENSIVE METABOLIC PANEL
ALBUMIN: 3.9 g/dL (ref 3.5–5.2)
ALK PHOS: 65 U/L (ref 39–117)
ALT: 14 U/L (ref 0–53)
AST: 19 U/L (ref 0–37)
BILIRUBIN TOTAL: 1.7 mg/dL — AB (ref 0.3–1.2)
BUN: 12 mg/dL (ref 6–23)
CHLORIDE: 108 meq/L (ref 96–112)
CO2: 23 mEq/L (ref 19–32)
CREATININE: 0.78 mg/dL (ref 0.50–1.35)
Calcium: 8.7 mg/dL (ref 8.4–10.5)
GFR calc non Af Amer: >90 mL/min (ref >90)
GLUCOSE: 85 mg/dL (ref 70–99)
POTASSIUM: 3.2 meq/L — AB (ref 3.7–5.3)
Sodium: 142 mEq/L (ref 137–147)
Total Protein: 6.9 g/dL (ref 6.0–8.3)

## 2013-10-27 LAB — I-STAT TROPONIN, ED: Troponin i, poc: 0 ng/mL (ref 0.00–0.08)

## 2013-10-27 MED ORDER — AZITHROMYCIN 250 MG PO TABS: 250.0000 mg | ORAL_TABLET | Freq: Every day | ORAL | Status: DC

## 2013-10-27 MED ORDER — SODIUM CHLORIDE 0.9 % IV BOLUS (SEPSIS)
1000.0000 mL | Freq: Once | INTRAVENOUS | Status: AC
  Administered 2013-10-27: 1000 mL via INTRAVENOUS

## 2013-10-27 MED ORDER — POTASSIUM CHLORIDE CRYS ER 20 MEQ PO TBCR: 20.0000 meq | EXTENDED_RELEASE_TABLET | Freq: Two times a day (BID) | ORAL | Status: DC

## 2013-10-27 MED ORDER — POTASSIUM CHLORIDE CRYS ER 20 MEQ PO TBCR
20.0000 meq | EXTENDED_RELEASE_TABLET | Freq: Once | ORAL | Status: AC
  Administered 2013-10-27: 20 meq via ORAL
  Filled 2013-10-27: qty 1

## 2013-10-27 NOTE — ED Notes (Signed)
Patient sates he was working Wednesday and he felt SOB, felt tight all over.

## 2013-10-27 NOTE — ED Provider Notes (Addendum)
CSN: [id number]     Arrival date & time 10/27/13  0010 History   First MD Initiated Contact with Patient 10/27/13 [phone]     Chief Complaint  Patient presents with  . Shortness of Breath     (Consider location/radiation/quality/duration/timing/severity/associated sxs/prior Treatment) HPI Comments: Pt presents with gradual onset of generalized fatigue and mild SOB that started 13 hours prior to evaluation.  This has been constant - he has no focal weakness.  Nothing makes better or worse, no pain in chest, back, legs or arms and no ha, change in vision or palpitations.  He has never felt this before - he notes that his urine has been yellow but no dysuria and he drink very little - works Architect.  No rashes, tick bites, and no hx of weight loss or thyroid dysfunction.  He does have anemia as Dx in the past in ED but does not take any meds.  No other PMH.  Patient is a 53 y.o. male presenting with shortness of breath. The history is provided by the patient and a relative. The history is limited by a language barrier. A language interpreter was used.  Shortness of Breath   Past Medical History  Diagnosis Date  . Hypokalemia   . Atypical chest pain      Noncardiac in etiology with negative cardiac enzymes 2-D echo and EKG during December 2011 admission  . Anemia   . Hemoglobin H constant spring variant   . Vitamin D deficiency    History reviewed. No pertinent past surgical history. History reviewed. No pertinent family history. History  Substance Use Topics  . Smoking status: Former Smoker    Quit date: 07/11/1996  . Smokeless tobacco: Not on file  . Alcohol Use: No    Review of Systems  Respiratory: Positive for shortness of breath.   All other systems reviewed and are negative.     Allergies  Review of patient's allergies indicates no known allergies.  Home Medications   Prior to Admission medications   Medication Sig Start Date End Date Taking? Authorizing Provider   azithromycin (ZITHROMAX Z-PAK) 250 MG tablet Take 1 tablet (250 mg total) by mouth daily. 500mg  PO day 1, then 250mg  PO days 205 10/27/13   Johnna Acosta, MD  pantoprazole (PROTONIX) 20 MG tablet Take 1 tablet (20 mg total) by mouth daily. 12/30/12   Jenetta Downer, MD  potassium chloride SA (K-DUR,KLOR-CON) 20 MEQ tablet Take 1 tablet (20 mEq total) by mouth 2 (two) times daily. 10/27/13   Johnna Acosta, MD   BP 115/67  Pulse 57  Temp(Src) 98.5 F (36.9 C) (Oral)  Resp 14  SpO2 98% Physical Exam  Nursing note and vitals reviewed. Constitutional: He appears well-developed and well-nourished. No distress.  HENT:  Head: Normocephalic and atraumatic.  Mouth/Throat: Oropharynx is clear and moist. No oropharyngeal exudate.  Eyes: Conjunctivae and EOM are normal. Pupils are equal, round, and reactive to light. Right eye exhibits no discharge. Left eye exhibits no discharge. No scleral icterus.  Neck: Normal range of motion. Neck supple. No JVD present. No thyromegaly present.  Cardiovascular: Normal rate, regular rhythm, normal heart sounds and intact distal pulses.  Exam reveals no gallop and no friction rub.   No murmur heard. Pulmonary/Chest: Effort normal and breath sounds normal. No respiratory distress. He has no wheezes. He has no rales.  Abdominal: Soft. Bowel sounds are normal. He exhibits no distension and no mass. There is no tenderness.  Musculoskeletal: Normal range  of motion. He exhibits no edema and no tenderness.  Lymphadenopathy:    He has no cervical adenopathy.  Neurological: He is alert. Coordination normal.  Skin: Skin is warm and dry. No rash noted. No erythema.  Psychiatric: He has a normal mood and affect. His behavior is normal.    ED Course  Procedures (including critical care time) Labs Review Labs Reviewed  CBC WITH DIFFERENTIAL - Abnormal; Notable for the following:    Hemoglobin 10.8 (*)    HCT 34.9 (*)    MCV 73.3 (*)    MCH 22.7 (*)    RDW 15.7 (*)     Neutrophils Relative % 78 (*)    All other components within normal limits  COMPREHENSIVE METABOLIC PANEL - Abnormal; Notable for the following:    Potassium 3.2 (*)    Total Bilirubin 1.7 (*)    All other components within normal limits  URINALYSIS, ROUTINE W REFLEX MICROSCOPIC  I-STAT TROPOININ, ED    Imaging Review Dg Chest 2 View  10/27/2013   CLINICAL DATA:  Shortness of breath  EXAM: CHEST  2 VIEW  COMPARISON:  05/03/2010  FINDINGS: Normal heart size and mediastinal contours. No acute infiltrate or edema. No effusion or pneumothorax. No acute osseous findings.  IMPRESSION: No active cardiopulmonary disease.   Electronically Signed   By: Jorje Guild M.D.   On: 10/27/2013 02:03     EKG Interpretation   Date/Time:  Thursday October 27 2013 00:45:34 EDT Ventricular Rate:  56 PR Interval:  152 QRS Duration: 92 QT Interval:  421 QTC Calculation: 406 R Axis:   77 Text Interpretation:  Sinus rhythm Normal ECG since last tracing no  significant change Confirmed by Keneshia Tena  MD, Rosalinda Seaman ([id number]) on 10/27/2013  1:37:50 AM      MDM   Final diagnoses:  Hypokalemia  Anemia    Normal exam, normal VS, check CXR, labs - hx of hypokalemia - urinalysis to eval for dehydration / uinfection.  Bolus.  K of 3.2, otherwise, anemia (not new), VS normal, pt informed, stable for d/c.  Xray normal  ojn d/c pt now complains of one month of cough - sputum, and now with weakness, lungs clear, sats normal - z pak for ? Walking pna.  Meds given in ED:  Medications  potassium chloride SA (K-DUR,KLOR-CON) CR tablet 20 mEq (not administered)  sodium chloride 0.9 % bolus 1,000 mL (1,000 mLs Intravenous New Bag/Given 10/27/13 0244)    New Prescriptions   AZITHROMYCIN (ZITHROMAX Z-PAK) 250 MG TABLET    Take 1 tablet (250 mg total) by mouth daily. 500mg  PO day 1, then 250mg  PO days 205   POTASSIUM CHLORIDE SA (K-DUR,KLOR-CON) 20 MEQ TABLET    Take 1 tablet (20 mEq total) by mouth 2 (two) times daily.       Johnna Acosta, MD 10/27/13 7989  Johnna Acosta, MD 10/27/13 [phone]

## 2013-10-27 NOTE — Discharge Instructions (Signed)
Low potassium, mild anemia all other tests normal including your xray.  Please call your doctor for a followup appointment within 24-48 hours. When you talk to your doctor please let them know that you were seen in the emergency department and have them acquire all of your records so that they can discuss the findings with you and formulate a treatment plan to fully care for your new and ongoing problems.   Emergency Department Resource Guide 1) Find a Doctor and Pay Out of Pocket Although you won't have to find out who is covered by your insurance plan, it is a good idea to ask around and get recommendations. You will then need to call the office and see if the doctor you have chosen will accept you as a new patient and what types of options they offer for patients who are self-pay. Some doctors offer discounts or will set up payment plans for their patients who do not have insurance, but you will need to ask so you aren't surprised when you get to your appointment.  2) Contact Your Local Health Department Not all health departments have doctors that can see patients for sick visits, but many do, so it is worth a call to see if yours does. If you don't know where your local health department is, you can check in your phone book. The CDC also has a tool to help you locate your state's health department, and many state websites also have listings of all of their local health departments.  3) Find a [facility] If your illness is not likely to be very severe or complicated, you may want to try a walk in clinic. These are popping up all over the country in pharmacies, drugstores, and shopping centers. They're usually staffed by nurse practitioners or physician assistants that have been trained to treat common illnesses and complaints. They're usually fairly quick and inexpensive. However, if you have serious medical issues or chronic medical problems, these are probably not your best option.  No  Primary Care Doctor: - Call Health Connect at  [phone] - they can help you locate a primary care doctor that  accepts your insurance, provides certain services, etc. - Physician Referral Service- [phone]  Chronic Pain Problems: Organization         Address  Phone   Notes  [facility]  [phone] Patients need to be referred by their primary care doctor.   Medication Assistance: Organization         Address  Phone   Notes  [facility] Medication [facility] [place], [place] [address] [phone] --Must be a resident of [facility] -- Must have NO insurance coverage whatsoever (no Medicaid/ Medicare, etc.) -- The pt. MUST have a primary care doctor that directs their care regularly and follows them in the community   Med[facility]ist  [phone]   [employer]  [phone]    Agencies that provide inexpensive medical care: Organization         Address  Phone   Notes  [place]  [phone]   [facility]    [phone]   [facility] [place], [place] [address] [phone]   [place] [address] [place] [phone]   Planned Parenthood    [phone]   [facility]    [phone]   Community Health  and [place] [address], [place] Phone:  [phone], Fax:  [phone] Hours of Operation:  9 am - 6 pm, M-F.  Also accepts Medicaid/Medicare and self-pay.  [facility] for [place], [address], [place] Phone: [phone], Fax: [phone]. Hours of Operation:  8:30 am - 5:30 pm, M-F.  Also accepts Medicaid and self-pay.  [facility] [place] [address] [place] Phone: [phone]   [place], [place], [place] [phone], Ext. [phone] Mondays & Thursdays: 7-9 AM.  First 15 patients are seen on  a first come, first serve basis.    [place] Providers:  Organization         Address  Phone   Notes  [facility] [address], [place] [phone] Also accepts self-pay patients.  [facility] [id number] [place], [place]  [phone]   [place], [address], [place] [phone]   [facility] [address] [place] [phone]   Lucianne Lei [address] [place] [address], [place]   [phone] Only accepts [place] Access [place] patients after they have their name applied to their card.   Self-Pay (no insurance) in [facility] [place] [facility]:  Organization         Address  Phone   Notes  Sickle Cell Patients, [facility] [place] [phone]   [facility] [place] [phone]   [facility] [place], [place], [place] [phone]   [facility]/Dr. Osei-Bonsu  [address], [place] or [address], [place] [phone] Phone number for both [place] and [place] locations is the same.  Urgent Medical and [facility] [address], [place] [phone]   [facility] [address] [place] or [address] [phone]   [facility] [address] [place] [phone], phone; [phone], fax Sees patients 1st and 3rd Saturday of every month.  Must not qualify for public or private insurance (i.e. Medicaid, Medicare, [place] Health Choice, Veterans' Benefits)  Household income should be no more than 200% of the poverty level The clinic cannot treat you if you are pregnant or think you are pregnant  Sexually transmitted diseases are not treated at the clinic.    Dental Care: Organization          Address  Phone  Notes  [facility] [place] [phone] Accepts children up to age 72 who are enrolled in [place] or [place]; pregnant women with a Medicaid card; and children who have applied for Medicaid or [place] Health Choice, but were declined, whose parents can pay a reduced fee at time of service.  [facility] Department of [facility]  [address], [place] [phone] Accepts children up to age 26 who are enrolled in [place] or [place]; pregnant women with a Medicaid card; and children who have applied for Medicaid or [place] Health Choice, but were declined, whose parents can pay a reduced fee at time of service.  [place] [facility]  [address]  Mardene Speak [phone] Patients are seen by appointment only. Walk-ins are not accepted. [place] will see patients 60 years of age and older. Monday - Tuesday (8am-5pm) Most Wednesdays (8:30-5pm) $30 per visit, cash only  [facility] Adult Dental Access PROGRAM  [address], [facility] [phone] Patients are seen by appointment only. Walk-ins are not accepted. [place] will see patients 9 years of age and older. One Wednesday Evening (Monthly: Volunteer Based).  $30 per visit, cash only  [place]  [phone] for adults; Children under age 8, call Graduate Pediatric Dentistry at [phone]. Children aged 87-14, please call [phone] to request a pediatric application.  Dental services are provided in all areas of dental care including fillings, crowns and bridges, complete and partial dentures, implants, gum treatment, root canals, and extractions. Preventive care is also provided. Treatment is provided to both adults and children. Patients are selected via a lottery and there is often a waiting list.   [facility] [address], [place]  [phone] [email or web address]   [facility] [address] [place], [place] [phone], Ext. [phone] Second and Fourth Thursday of each month, opens at 6:30 AM; Clinic ends at 9 AM.  Patients are seen on a first-come first-served basis, and a limited number are seen during each clinic.   [facility]  [address] Hillard Danker [place], [place] [phone]   Eligibility Requirements You must have lived in [place], [place], or [place] counties for at least the last three months.   You cannot be eligible for state or federal sponsored [employer], including [employer], [place], or [employer].   You generally cannot be eligible for healthcare insurance through your employer.    How to apply: Eligibility screenings are held every Tuesday and Wednesday afternoon from 1:00 pm until 4:00 pm. You do not need an appointment for the interview!  [facility] [address] [place], [place]   [place]  [place] Department  [place]  [phone]    Behavioral Health Resources in the Community: Intensive Outpatient Programs Organization         Address  Phone  Notes  [place]. [address], [place], [place] [phone]   [facility] Outpatient [address], [place], [place]   ADS: Alcohol & Drug Svcs [address], [place], [place]   [place] [address]  [place], [place] or [phone]   Substance Abuse Resources Organization         Address  Phone  Notes  Alcohol and Drug Services  [phone]   [place]  [phone]   [facility] [place]   Chinita Pester  [phone]   Residential & Outpatient Substance Abuse Program  [phone]   Psychological  Services Organization         Address  Phone  Notes  [facility] [place]  [place]  [phone]   [place] [address] [place] or [phone]    Mobile Crisis Teams Organization         Address  Phone  Notes  [facility], Mobile Crisis Care Unit  [phone]   [facility]ive Psychotherapeutic Services  [address] [place], [place]   [place] Levels  [place] [phone]    Self-Help/Support Groups Organization         Address  Phone             Notes  Mental Health Assoc. of [place] - variety of support groups  [place] Call for more information  Narcotics Anonymous (NA), Caring [facility] [address], [employer] [place]  2 meetings at this location   Special educational needs teacher         Address  Phone  Notes  [facility] Residential Treatment [place],    [place]  [phone]   [facility]  [address] [place] [id number], [employer], [place]   [place], [place] [phone] Admissions: 8am-3pm M-F  Incentives Substance [place] [address],    [place], [place] [phone]   [facility] [address] [place], [place], [place]   [facility] [address],  [place], [place]   [facility] - Intensive Outpatient [address] Krist[address], [place], [place]   [facility] [place] ([place] [address]  [place], [place] [phone] or [phone]   [facility]idential Treatment Services (RTS) [address] [place], [place] Accepts Medicaid  [facility] [place] [address]  [place] [phone] Substance Abuse/Addiction Treatment   [facility] Organization         Address  Phone  Notes  [facility]  [phone]   Domenic Schwab, PhD [address] Arlis Porta [place], [place]   [phone] or [phone]   [place], [place] [phone]   [facility] [address] [place], [place] [phone] Insurance/Medicaid/sponsorship through [facility] and Families [address]                                    [place], [place] [phone] [place] [address], [place] [phone]    Dr. Adele Schilder  [phone]   [facility] Dept. 1) [address], [place] 2) [place] 3)  [place] [address], [place] [phone]  [phone]   [place] [phone] or [phone] (After Hours)

## 2013-10-27 NOTE — ED Notes (Signed)
Per interpreter on phone line, pt has been feeling weak and feels like he is going to pass out.  Pt not reporting any other symptoms.  Noted to be weak during assessment.  No other s/s reported.

## 2014-05-14 ENCOUNTER — Emergency Department (HOSPITAL_COMMUNITY)
Admission: EM | Admit: 2014-05-14 | Discharge: 2014-05-14 | Disposition: A | Discharge status: Home / Self Care | Payer: 59 | Attending: Emergency Medicine | Admitting: Emergency Medicine

## 2014-05-14 ENCOUNTER — Emergency Department (HOSPITAL_COMMUNITY): Payer: 59

## 2014-05-14 ENCOUNTER — Encounter (HOSPITAL_COMMUNITY): Payer: Self-pay | Admitting: Emergency Medicine

## 2014-05-14 DIAGNOSIS — G44219 Episodic tension-type headache, not intractable: Secondary | ICD-10-CM | POA: Diagnosis not present

## 2014-05-14 DIAGNOSIS — Z862 Personal history of diseases of the blood and blood-forming organs and certain disorders involving the immune mechanism: Secondary | ICD-10-CM | POA: Diagnosis not present

## 2014-05-14 DIAGNOSIS — Y998 Other external cause status: Secondary | ICD-10-CM | POA: Diagnosis not present

## 2014-05-14 DIAGNOSIS — Y9389 Activity, other specified: Secondary | ICD-10-CM | POA: Diagnosis not present

## 2014-05-14 DIAGNOSIS — W1839XA Other fall on same level, initial encounter: Secondary | ICD-10-CM | POA: Insufficient documentation

## 2014-05-14 DIAGNOSIS — Y9289 Other specified places as the place of occurrence of the external cause: Secondary | ICD-10-CM | POA: Diagnosis not present

## 2014-05-14 DIAGNOSIS — Z8639 Personal history of other endocrine, nutritional and metabolic disease: Secondary | ICD-10-CM | POA: Insufficient documentation

## 2014-05-14 DIAGNOSIS — Z87891 Personal history of nicotine dependence: Secondary | ICD-10-CM | POA: Insufficient documentation

## 2014-05-14 DIAGNOSIS — K219 Gastro-esophageal reflux disease without esophagitis: Secondary | ICD-10-CM | POA: Diagnosis not present

## 2014-05-14 DIAGNOSIS — Z043 Encounter for examination and observation following other accident: Secondary | ICD-10-CM | POA: Diagnosis not present

## 2014-05-14 DIAGNOSIS — R51 Headache: Secondary | ICD-10-CM | POA: Diagnosis present

## 2014-05-14 DIAGNOSIS — R52 Pain, unspecified: Secondary | ICD-10-CM

## 2014-05-14 LAB — COMPREHENSIVE METABOLIC PANEL
ALK PHOS: 68 U/L (ref 39–117)
ALT: 16 U/L (ref 0–53)
AST: 19 U/L (ref 0–37)
Albumin: 4.2 g/dL (ref 3.5–5.2)
Anion gap: 15 (ref 5–15)
BUN: 20 mg/dL (ref 6–23)
CO2: 22 mEq/L (ref 19–32)
Calcium: 9 mg/dL (ref 8.4–10.5)
Chloride: 104 mEq/L (ref 96–112)
Creatinine, Ser: 0.72 mg/dL (ref 0.50–1.35)
GFR calc non Af Amer: >90 mL/min (ref >90)
GLUCOSE: 67 mg/dL — AB (ref 70–99)
POTASSIUM: 3.7 meq/L (ref 3.7–5.3)
SODIUM: 141 meq/L (ref 137–147)
TOTAL PROTEIN: 7.2 g/dL (ref 6.0–8.3)
Total Bilirubin: 1.7 mg/dL — ABNORMAL HIGH (ref 0.3–1.2)

## 2014-05-14 LAB — CBC WITH DIFFERENTIAL/PLATELET
Basophils Absolute: 0 10*3/uL (ref 0.0–0.1)
Basophils Relative: 0 % (ref 0–1)
EOS ABS: 0.1 10*3/uL (ref 0.0–0.7)
Eosinophils Relative: 1 % (ref 0–5)
HCT: 37.7 % — ABNORMAL LOW (ref 39.0–52.0)
Hemoglobin: 11.8 g/dL — ABNORMAL LOW (ref 13.0–17.0)
LYMPHS PCT: 21 % (ref 12–46)
Lymphs Abs: 0.9 10*3/uL (ref 0.7–4.0)
MCH: 22.8 pg — ABNORMAL LOW (ref 26.0–34.0)
MCHC: 31.3 g/dL (ref 30.0–36.0)
MCV: 72.9 fL — ABNORMAL LOW (ref 78.0–100.0)
Monocytes Absolute: 0.2 10*3/uL (ref 0.1–1.0)
Monocytes Relative: 6 % (ref 3–12)
NEUTROS PCT: 72 % (ref 43–77)
Neutro Abs: 3.1 10*3/uL (ref 1.7–7.7)
Platelets: 178 10*3/uL (ref 150–400)
RBC: 5.17 MIL/uL (ref 4.22–5.81)
RDW: 15.3 % (ref 11.5–15.5)
WBC: 4.2 10*3/uL (ref 4.0–10.5)

## 2014-05-14 LAB — I-STAT TROPONIN, ED: Troponin i, poc: 0 ng/mL (ref 0.00–0.08)

## 2014-05-14 LAB — TROPONIN I

## 2014-05-14 MED ORDER — SODIUM CHLORIDE 0.9 % IV BOLUS (SEPSIS)
1000.0000 mL | Freq: Once | INTRAVENOUS | Status: AC
  Administered 2014-05-14: 1000 mL via INTRAVENOUS

## 2014-05-14 MED ORDER — PANTOPRAZOLE SODIUM 40 MG PO TBEC: 40.0000 mg | DELAYED_RELEASE_TABLET | Freq: Every day | ORAL | Status: DC

## 2014-05-14 MED ORDER — IBUPROFEN 400 MG PO TABS
600.0000 mg | ORAL_TABLET | Freq: Once | ORAL | Status: AC
  Administered 2014-05-14: 600 mg via ORAL
  Filled 2014-05-14 (×2): qty 1

## 2014-05-14 NOTE — ED Notes (Signed)
Pt undressed, in gown, on monitor, continuous pulse oximetry and blood pressure cuff; family at bedside 

## 2014-05-14 NOTE — ED Notes (Signed)
To ED via private vehicle from home-- c/o weakness, fatigue, headache. PT SPEAKS [place] ONLY .  Niece on phone assisted in interpretation. No interpretor on Language line for this dialect.   [phone]  Lus -- niece. . Alert/oriented x 4, w/d.

## 2014-05-14 NOTE — ED Notes (Signed)
Pt up ambulatory to the bathroom with minimal assistance; family member walking with him to bathroom

## 2014-05-14 NOTE — ED Provider Notes (Signed)
CSN: [id number]     Arrival date & time 05/14/14  4097 History   First MD Initiated Contact with Patient 05/14/14 [phone]     Chief Complaint  Patient presents with  . Headache  . Fall  . Weakness     (Consider location/radiation/quality/duration/timing/severity/associated sxs/prior Treatment) HPI Pt is a 53yo with h/o anemia and reflux who presents with weakness, dizziness, headache and fall. For the past week he has had a headache in the back of his head and has felt weak and dizzy. Yesterday he was feeling particularly dizzy and weak and fell, did not lose consciousness and was able to catch himself. He denies n/v/d and has a good appetite. He endorses mild abdominal pain for the past few days. He denies blood in his stool or black stool. He endorses taking ibuprofen occasionally but not recently. He has reportedly been put on protonix for his anemia in the past but it is unclear if he had PUD.  He is a Leisure centre manager immigrant who speaks very limited english and there is no Optometrist available so history was obtained with the assistance of the patient's niece on the phone.  Past Medical History  Diagnosis Date  . Hypokalemia   . Atypical chest pain      Noncardiac in etiology with negative cardiac enzymes 2-D echo and EKG during December 2011 admission  . Anemia   . Hemoglobin H constant spring variant   . Vitamin D deficiency    History reviewed. No pertinent past surgical history. No family history on file. History  Substance Use Topics  . Smoking status: Former Smoker    Quit date: 07/11/1996  . Smokeless tobacco: Not on file  . Alcohol Use: No    Review of Systems See HPI   Allergies  Review of patient's allergies indicates no known allergies.  Home Medications   Prior to Admission medications   Medication Sig Start Date End Date Taking? Authorizing Provider  pantoprazole (PROTONIX) 40 MG tablet Take 1 tablet (40 mg total) by mouth daily. 05/14/14   Frazier Richards, MD    BP 121/72 mmHg  Pulse 58  Temp(Src) 98.1 F (36.7 C) (Oral)  Resp 17  SpO2 100% Physical Exam  Constitutional: He is oriented to person, place, and time. He appears well-developed and well-nourished. No distress.  HENT:  Head: Normocephalic and atraumatic.  Mouth/Throat: Oropharynx is clear and moist. No oropharyngeal exudate.  Eyes: Conjunctivae and EOM are normal. Pupils are equal, round, and reactive to light. Right eye exhibits no discharge. Left eye exhibits no discharge. No scleral icterus.  Cardiovascular: Normal rate, regular rhythm, normal heart sounds and intact distal pulses.  Exam reveals no gallop and no friction rub.   No murmur heard. Pulmonary/Chest: Effort normal and breath sounds normal. No respiratory distress. He has no wheezes.  Abdominal: Soft. Bowel sounds are normal. He exhibits no distension. There is no tenderness.  Neurological: He is alert and oriented to person, place, and time. He displays no atrophy and no tremor. No cranial nerve deficit or sensory deficit. He exhibits normal muscle tone.  4/5 right grip strength compared to 5/5 left, normal strength in BLE.  Skin: Skin is warm and dry. No rash noted. He is not diaphoretic. No erythema.  Psychiatric: He has a normal mood and affect. His behavior is normal.  Nursing note and vitals reviewed.   ED Course  Procedures (including critical care time) Labs Review Labs Reviewed  CBC WITH DIFFERENTIAL - Abnormal; Notable for the  following:    Hemoglobin 11.8 (*)    HCT 37.7 (*)    MCV 72.9 (*)    MCH 22.8 (*)    All other components within normal limits  COMPREHENSIVE METABOLIC PANEL - Abnormal; Notable for the following:    Glucose, Bld 67 (*)    Total Bilirubin 1.7 (*)    All other components within normal limits  TROPONIN I  Randolm Idol, ED    Imaging Review Dg Chest 2 View  05/14/2014   CLINICAL DATA:  Headache, generalized chest pain.  EXAM: CHEST  2 VIEW  COMPARISON:  Chest radiograph  10/27/2013  FINDINGS: Normal cardiac and mediastinal contours. No consolidative pulmonary opacities. No pleural effusion or pneumothorax. Regional skeleton is unremarkable.  IMPRESSION: No acute cardiopulmonary process.   Electronically Signed   By: Lovey Newcomer M.D.   On: 05/14/2014 09:32   Ct Head Wo Contrast  05/14/2014   CLINICAL DATA:  Generalized weakness, headache and fatigue.  EXAM: CT HEAD WITHOUT CONTRAST  TECHNIQUE: Contiguous axial images were obtained from the base of the skull through the vertex without intravenous contrast.  COMPARISON:  05/01/2010  FINDINGS: Ventricles and sulci are appropriate for patient age. Periventricular and subcortical white matter hypodensity compatible with chronic small vessel ischemic change. No evidence for acute cortically based infarct, intracranial hemorrhage, mass lesion or mass-effect. Orbits are unremarkable paranasal sinuses are well aerated. Mastoid air cells are unremarkable. Calvarium is intact.  IMPRESSION: No acute intracranial process.  Chronic small vessel ischemic change.   Electronically Signed   By: Lovey Newcomer M.D.   On: 05/14/2014 09:28     EKG Interpretation None      MDM   Final diagnoses:  Gastroesophageal reflux disease, esophagitis presence not specified  Episodic tension-type headache, not intractable   Weakness with focal decrease in RUE on exam, will get CT head.  Given limited history, will also get CBC, CMP, CXR and give fluid bolus.  Labs and imaging all normal. Hemoglobin improved. Will rx protonix for reflux and rec f/u to reestablish w/ primary care (has seen IM but several years ago)   Frazier Richards, MD 05/14/14 [place], MD 05/14/14 [place], MD 05/14/14 [phone]

## 2014-05-14 NOTE — Discharge Instructions (Signed)

## 2014-06-02 ENCOUNTER — Ambulatory Visit (INDEPENDENT_AMBULATORY_CARE_PROVIDER_SITE_OTHER): Payer: 59 | Admitting: Family Medicine

## 2014-06-02 ENCOUNTER — Telehealth: Payer: Self-pay | Admitting: Family Medicine

## 2014-06-02 ENCOUNTER — Ambulatory Visit (INDEPENDENT_AMBULATORY_CARE_PROVIDER_SITE_OTHER): Payer: 59

## 2014-06-02 ENCOUNTER — Encounter: Payer: Self-pay | Admitting: Family Medicine

## 2014-06-02 VITALS — BP 113/69 | HR 64 | Temp 98.2°F | Ht 61.0 in | Wt 119.5 lb

## 2014-06-02 DIAGNOSIS — R1084 Generalized abdominal pain: Secondary | ICD-10-CM

## 2014-06-02 DIAGNOSIS — Z23 Encounter for immunization: Secondary | ICD-10-CM

## 2014-06-02 DIAGNOSIS — D509 Iron deficiency anemia, unspecified: Secondary | ICD-10-CM

## 2014-06-02 DIAGNOSIS — R109 Unspecified abdominal pain: Secondary | ICD-10-CM | POA: Insufficient documentation

## 2014-06-02 LAB — POCT H PYLORI SCREEN: H PYLORI SCREEN, POC: POSITIVE

## 2014-06-02 MED ORDER — CLARITHROMYCIN 500 MG PO TABS: 500.0000 mg | ORAL_TABLET | Freq: Two times a day (BID) | ORAL | Status: DC

## 2014-06-02 MED ORDER — OMEPRAZOLE 20 MG PO CPDR: 20.0000 mg | DELAYED_RELEASE_CAPSULE | Freq: Every day | ORAL | Status: DC

## 2014-06-02 MED ORDER — OMEPRAZOLE 20 MG PO CPDR: 20.0000 mg | DELAYED_RELEASE_CAPSULE | Freq: Two times a day (BID) | ORAL | Status: DC

## 2014-06-02 MED ORDER — AMOXICILLIN 500 MG PO TABS: 1000.0000 mg | ORAL_TABLET | Freq: Two times a day (BID) | ORAL | Status: DC

## 2014-06-02 NOTE — Assessment & Plan Note (Signed)
Dx made of Hgb H constant spring. Most likely the source of his ongoing anemia. No acute bleeding  - anemia panel: pending

## 2014-06-02 NOTE — Progress Notes (Signed)
   Subjective:    Patient ID: Kyle Cooke, male    DOB: Nov 22, 1960, 54 y.o.   MRN: [id number]  HPI  Bilal Manzer is here for est care.  Pain is from [place] and speaks [place] Interpretor was used for this visit.   Ab pain: has been present since last August '15. He was seen in the ED 2014 for abdominal pain.  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 Lipase and amylase at that time were normal. UA and LFT's were normal.  He was given pantoprazole and hasn't helped. He denies any hematochezia, melana, or hemoptysis. He denies fevers or chills. The pain hasn't improved. Not related to food. Is worse when he is lying down and better when he is sitting up.   Current Outpatient Prescriptions on File Prior to Visit  Medication Sig Dispense Refill  . pantoprazole (PROTONIX) 40 MG tablet Take 1 tablet (40 mg total) by mouth daily. 30 tablet 0   No current facility-administered medications on file prior to visit.    SHx: no smoking or alcohol   Health Maintenance: needs colonoscopy.    Review of Systems See HPI     Objective:   Physical Exam BP 113/69 mmHg  Pulse 64  Temp(Src) 98.2 F (36.8 C) (Oral)  Ht 5\' 1"  (1.549 m)  Wt 119 lb 8 oz (54.205 kg)  BMI 22.59 kg/m2 Gen: NAD, alert, cooperative with exam, well-appearing HEENT: NCAT, PERRL, clear conjunctiva,  CV: RRR, good S1/S2, no murmur,  Resp: CTABL, no wheezes, non-labored Abd: SNTND, BS present, no guarding or organomegaly Skin: no rashes, normal turgor  Neuro: no gross deficits.       Assessment & Plan:

## 2014-06-02 NOTE — Progress Notes (Signed)
Patient was accompanied by interpreter Laurin Coder from [employer] because of language barrier.Ozella Almond

## 2014-06-02 NOTE — Assessment & Plan Note (Addendum)
H. Pylori positive. Symptoms more likely related to the infection.  - omeprazole 20 mg BID  - Amoxicillin 1 g BID #20 for 10 days  - Clarithromycin 500 mg BID for 10 days  - referral made to GI prior to diagnosis of H. Pylori.  - Discussed with Dr. Mingo Amber.

## 2014-06-02 NOTE — Patient Instructions (Signed)
Thank you for coming in,   We will call you with the results from today.   I am also referring you to gastroenterology to try to find out why you are having the abdominal pain.    Please feel free to call with any questions or concerns at any time, at [phone]. --Dr. Raeford Razor

## 2014-06-03 LAB — ANEMIA PANEL 7
%SAT: 46 % (ref 20–55)
ABS Retic: 226.8 10*3/uL — ABNORMAL HIGH (ref 19.0–186.0)
Ferritin: 373 ng/mL — ABNORMAL HIGH (ref 22–322)
Folate: 9.7 ng/mL
HCT: 36 % — ABNORMAL LOW (ref 39.0–52.0)
HEMOGLOBIN: 11 g/dL — AB (ref 13.0–17.0)
Iron: 91 ug/dL (ref 42–165)
MCH: 22.3 pg — ABNORMAL LOW (ref 26.0–34.0)
MCHC: 30.6 g/dL (ref 30.0–36.0)
MCV: 73 fL — ABNORMAL LOW (ref 78.0–100.0)
MPV: 10.5 fL (ref 8.6–12.4)
PLATELETS: 205 10*3/uL (ref 150–400)
RBC.: 4.93 MIL/uL (ref 4.22–5.81)
RBC: 4.93 MIL/uL (ref 4.22–5.81)
RDW: 15.8 % — ABNORMAL HIGH (ref 11.5–15.5)
Retic Ct Pct: 4.6 % — ABNORMAL HIGH (ref 0.4–2.3)
TIBC: 196 ug/dL — ABNORMAL LOW (ref 215–435)
UIBC: 105 ug/dL — AB (ref 125–400)
Vitamin B-12: 610 pg/mL (ref 211–911)
WBC: 3.8 10*3/uL — ABNORMAL LOW (ref 4.0–10.5)

## 2014-08-04 ENCOUNTER — Inpatient Hospital Stay (HOSPITAL_COMMUNITY)
Admission: EM | Admit: 2014-08-04 | Discharge: 2014-08-07 | DRG: 872 | Disposition: A | Discharge status: Home / Self Care | Payer: 59 | Attending: Family Medicine | Admitting: Family Medicine

## 2014-08-04 ENCOUNTER — Emergency Department (HOSPITAL_COMMUNITY): Payer: 59

## 2014-08-04 ENCOUNTER — Encounter (HOSPITAL_COMMUNITY): Payer: Self-pay | Admitting: Emergency Medicine

## 2014-08-04 DIAGNOSIS — B9681 Helicobacter pylori [H. pylori] as the cause of diseases classified elsewhere: Secondary | ICD-10-CM | POA: Diagnosis present

## 2014-08-04 DIAGNOSIS — R0682 Tachypnea, not elsewhere classified: Secondary | ICD-10-CM | POA: Diagnosis present

## 2014-08-04 DIAGNOSIS — E876 Hypokalemia: Secondary | ICD-10-CM | POA: Diagnosis present

## 2014-08-04 DIAGNOSIS — D1803 Hemangioma of intra-abdominal structures: Secondary | ICD-10-CM | POA: Diagnosis present

## 2014-08-04 DIAGNOSIS — A419 Sepsis, unspecified organism: Secondary | ICD-10-CM | POA: Diagnosis present

## 2014-08-04 DIAGNOSIS — R161 Splenomegaly, not elsewhere classified: Secondary | ICD-10-CM | POA: Diagnosis present

## 2014-08-04 DIAGNOSIS — R079 Chest pain, unspecified: Secondary | ICD-10-CM | POA: Diagnosis present

## 2014-08-04 DIAGNOSIS — R000 Tachycardia, unspecified: Secondary | ICD-10-CM | POA: Diagnosis present

## 2014-08-04 DIAGNOSIS — Z79899 Other long term (current) drug therapy: Secondary | ICD-10-CM | POA: Diagnosis not present

## 2014-08-04 DIAGNOSIS — K219 Gastro-esophageal reflux disease without esophagitis: Secondary | ICD-10-CM | POA: Diagnosis present

## 2014-08-04 DIAGNOSIS — K529 Noninfective gastroenteritis and colitis, unspecified: Secondary | ICD-10-CM | POA: Diagnosis present

## 2014-08-04 DIAGNOSIS — D560 Alpha thalassemia: Secondary | ICD-10-CM | POA: Diagnosis present

## 2014-08-04 DIAGNOSIS — D509 Iron deficiency anemia, unspecified: Secondary | ICD-10-CM | POA: Diagnosis present

## 2014-08-04 DIAGNOSIS — K7689 Other specified diseases of liver: Secondary | ICD-10-CM | POA: Diagnosis present

## 2014-08-04 DIAGNOSIS — A084 Viral intestinal infection, unspecified: Secondary | ICD-10-CM | POA: Diagnosis present

## 2014-08-04 DIAGNOSIS — R0782 Intercostal pain: Secondary | ICD-10-CM | POA: Insufficient documentation

## 2014-08-04 DIAGNOSIS — R109 Unspecified abdominal pain: Secondary | ICD-10-CM

## 2014-08-04 LAB — URINALYSIS, ROUTINE W REFLEX MICROSCOPIC
BILIRUBIN URINE: NEGATIVE
GLUCOSE, UA: NEGATIVE mg/dL
HGB URINE DIPSTICK: NEGATIVE
Ketones, ur: 15 mg/dL — AB
Leukocytes, UA: NEGATIVE
Nitrite: NEGATIVE
PROTEIN: NEGATIVE mg/dL
Specific Gravity, Urine: 1.033 — ABNORMAL HIGH (ref 1.005–1.030)
Urobilinogen, UA: 0.2 mg/dL (ref 0.0–1.0)
pH: 8.5 — ABNORMAL HIGH (ref 5.0–8.0)

## 2014-08-04 LAB — CBC WITH DIFFERENTIAL/PLATELET
Basophils Absolute: 0 10*3/uL (ref 0.0–0.1)
Basophils Relative: 0 % (ref 0–1)
EOS ABS: 0 10*3/uL (ref 0.0–0.7)
EOS PCT: 0 % (ref 0–5)
HCT: 38 % — ABNORMAL LOW (ref 39.0–52.0)
HEMOGLOBIN: 12.4 g/dL — AB (ref 13.0–17.0)
LYMPHS ABS: 0.6 10*3/uL — AB (ref 0.7–4.0)
Lymphocytes Relative: 6 % — ABNORMAL LOW (ref 12–46)
MCH: 23.6 pg — ABNORMAL LOW (ref 26.0–34.0)
MCHC: 32.6 g/dL (ref 30.0–36.0)
MCV: 72.4 fL — ABNORMAL LOW (ref 78.0–100.0)
Monocytes Absolute: 0.6 10*3/uL (ref 0.1–1.0)
Monocytes Relative: 6 % (ref 3–12)
NEUTROS PCT: 88 % — AB (ref 43–77)
Neutro Abs: 8.3 10*3/uL — ABNORMAL HIGH (ref 1.7–7.7)
Platelets: 157 10*3/uL (ref 150–400)
RBC: 5.25 MIL/uL (ref 4.22–5.81)
RDW: 15.5 % (ref 11.5–15.5)
WBC: 9.5 10*3/uL (ref 4.0–10.5)

## 2014-08-04 LAB — COMPREHENSIVE METABOLIC PANEL
ALT: 25 U/L (ref 0–53)
AST: 33 U/L (ref 0–37)
Albumin: 4.3 g/dL (ref 3.5–5.2)
Alkaline Phosphatase: 73 U/L (ref 39–117)
Anion gap: 12 (ref 5–15)
BUN: 16 mg/dL (ref 6–23)
CALCIUM: 9.4 mg/dL (ref 8.4–10.5)
CO2: 20 mmol/L (ref 19–32)
Chloride: 106 mmol/L (ref 96–112)
Creatinine, Ser: 1.24 mg/dL (ref 0.50–1.35)
GFR calc Af Amer: 74 mL/min — ABNORMAL LOW (ref >90)
GFR calc non Af Amer: 64 mL/min — ABNORMAL LOW (ref >90)
GLUCOSE: 111 mg/dL — AB (ref 70–99)
POTASSIUM: 3.7 mmol/L (ref 3.5–5.1)
Sodium: 138 mmol/L (ref 135–145)
Total Bilirubin: 1.9 mg/dL — ABNORMAL HIGH (ref 0.3–1.2)
Total Protein: 7.4 g/dL (ref 6.0–8.3)

## 2014-08-04 LAB — I-STAT CHEM 8, ED
BUN: 19 mg/dL (ref 6–23)
CHLORIDE: 105 mmol/L (ref 96–112)
Calcium, Ion: 1.11 mmol/L — ABNORMAL LOW (ref 1.12–1.23)
Creatinine, Ser: 1 mg/dL (ref 0.50–1.35)
GLUCOSE: 104 mg/dL — AB (ref 70–99)
HEMATOCRIT: 42 % (ref 39.0–52.0)
HEMOGLOBIN: 14.3 g/dL (ref 13.0–17.0)
POTASSIUM: 3.8 mmol/L (ref 3.5–5.1)
Sodium: 139 mmol/L (ref 135–145)
TCO2: 18 mmol/L (ref 0–100)

## 2014-08-04 LAB — SEDIMENTATION RATE: Sed Rate: 4 mm/hr (ref 0–16)

## 2014-08-04 LAB — CLOSTRIDIUM DIFFICILE BY PCR: Toxigenic C. Difficile by PCR: NEGATIVE

## 2014-08-04 LAB — I-STAT CG4 LACTIC ACID, ED
Lactic Acid, Venous: 4.86 mmol/L (ref 0.5–2.0)
Lactic Acid, Venous: 1.75 mmol/L (ref 0.5–2.0)

## 2014-08-04 LAB — VITAMIN B12: Vitamin B-12: 437 pg/mL (ref 211–911)

## 2014-08-04 LAB — LIPASE, BLOOD: LIPASE: 44 U/L (ref 11–59)

## 2014-08-04 LAB — C-REACTIVE PROTEIN: CRP: 2.7 mg/dL — ABNORMAL HIGH (ref <0.60)

## 2014-08-04 LAB — FOLATE: Folate: 9.9 ng/mL

## 2014-08-04 LAB — IRON: Iron: 30 ug/dL — ABNORMAL LOW (ref 42–165)

## 2014-08-04 MED ORDER — ONDANSETRON HCL 4 MG/2ML IJ SOLN
4.0000 mg | Freq: Once | INTRAMUSCULAR | Status: AC
  Administered 2014-08-04: 4 mg via INTRAVENOUS
  Filled 2014-08-04: qty 2

## 2014-08-04 MED ORDER — SODIUM CHLORIDE 0.9 % IV SOLN
INTRAVENOUS | Status: DC
  Administered 2014-08-04: 08:00:00 via INTRAVENOUS

## 2014-08-04 MED ORDER — SODIUM CHLORIDE 0.9 % IV SOLN
INTRAVENOUS | Status: AC
  Administered 2014-08-04 – 2014-08-05 (×2): via INTRAVENOUS

## 2014-08-04 MED ORDER — ACETAMINOPHEN 650 MG RE SUPP
650.0000 mg | Freq: Once | RECTAL | Status: AC
  Administered 2014-08-04: 650 mg via RECTAL
  Filled 2014-08-04: qty 1

## 2014-08-04 MED ORDER — SODIUM CHLORIDE 0.9 % IV BOLUS (SEPSIS)
1000.0000 mL | Freq: Once | INTRAVENOUS | Status: AC
  Administered 2014-08-04: 1000 mL via INTRAVENOUS

## 2014-08-04 MED ORDER — METRONIDAZOLE IN NACL 5-0.79 MG/ML-% IV SOLN
500.0000 mg | Freq: Once | INTRAVENOUS | Status: AC
  Administered 2014-08-04: 500 mg via INTRAVENOUS
  Filled 2014-08-04: qty 100

## 2014-08-04 MED ORDER — METRONIDAZOLE IN NACL 5-0.79 MG/ML-% IV SOLN
500.0000 mg | Freq: Three times a day (TID) | INTRAVENOUS | Status: DC
  Administered 2014-08-04 – 2014-08-06 (×7): 500 mg via INTRAVENOUS
  Filled 2014-08-04 (×9): qty 100

## 2014-08-04 MED ORDER — IOHEXOL 300 MG/ML  SOLN
25.0000 mL | INTRAMUSCULAR | Status: DC
  Administered 2014-08-04: 25 mL via ORAL

## 2014-08-04 MED ORDER — CIPROFLOXACIN IN D5W 400 MG/200ML IV SOLN
400.0000 mg | Freq: Once | INTRAVENOUS | Status: AC
  Administered 2014-08-04: 400 mg via INTRAVENOUS
  Filled 2014-08-04: qty 200

## 2014-08-04 MED ORDER — HEPARIN SODIUM (PORCINE) 5000 UNIT/ML IJ SOLN
5000.0000 [IU] | Freq: Three times a day (TID) | INTRAMUSCULAR | Status: DC
  Administered 2014-08-04 – 2014-08-07 (×10): 5000 [IU] via SUBCUTANEOUS
  Filled 2014-08-04 (×12): qty 1

## 2014-08-04 MED ORDER — ACETAMINOPHEN 650 MG RE SUPP
650.0000 mg | Freq: Four times a day (QID) | RECTAL | Status: DC | PRN
Start: 2014-08-04 — End: 2014-08-07

## 2014-08-04 MED ORDER — ACETAMINOPHEN 325 MG PO TABS
650.0000 mg | ORAL_TABLET | Freq: Four times a day (QID) | ORAL | Status: DC | PRN
  Administered 2014-08-04 – 2014-08-06 (×3): 650 mg via ORAL
  Filled 2014-08-04 (×3): qty 2

## 2014-08-04 MED ORDER — CIPROFLOXACIN IN D5W 400 MG/200ML IV SOLN
400.0000 mg | Freq: Two times a day (BID) | INTRAVENOUS | Status: DC
  Administered 2014-08-04 – 2014-08-06 (×4): 400 mg via INTRAVENOUS
  Filled 2014-08-04 (×6): qty 200

## 2014-08-04 MED ORDER — MORPHINE SULFATE 4 MG/ML IJ SOLN
4.0000 mg | Freq: Once | INTRAMUSCULAR | Status: AC
  Administered 2014-08-04: 4 mg via INTRAVENOUS
  Filled 2014-08-04: qty 1

## 2014-08-04 MED ORDER — IOHEXOL 300 MG/ML  SOLN
100.0000 mL | Freq: Once | INTRAMUSCULAR | Status: AC | PRN
  Administered 2014-08-04: 100 mL via INTRAVENOUS

## 2014-08-04 MED ORDER — PANTOPRAZOLE SODIUM 40 MG PO TBEC
40.0000 mg | DELAYED_RELEASE_TABLET | Freq: Every day | ORAL | Status: DC
  Administered 2014-08-04 – 2014-08-07 (×4): 40 mg via ORAL
  Filled 2014-08-04 (×5): qty 1

## 2014-08-04 MED ORDER — MORPHINE SULFATE 2 MG/ML IJ SOLN
2.0000 mg | INTRAMUSCULAR | Status: DC | PRN
  Administered 2014-08-04 – 2014-08-07 (×10): 2 mg via INTRAVENOUS
  Filled 2014-08-04 (×11): qty 1

## 2014-08-04 NOTE — Progress Notes (Signed)
Pt arrived to 5W31. Pt alert and oriented x4. Pt speaks [place] Call bell in reach and bed alarm on. Will continue to monitor.

## 2014-08-04 NOTE — H&P (Signed)
[facility] Admission History and Physical Service Pager: [phone]  Patient name: Kyle Cooke Medical record number: [id number] Date of birth: [birth year] Age: 54 y.o. Gender: male  Primary Care Provider: No PCP Per Patient Consultants: None Code Status: Full  Chief Complaint: Abd pain, emesis, diarrhea  Assessment and Plan: Kyle Cooke is a 54 y.o. male presenting with Donald pain, diarrhea, emesis, and fever with CT findings suspicious for colitis . PMH is significant for recurrent abdominal pain, thalassemia, H pylori infection.  Sirs Syndrome, Abdominal pain- Colitis - Patient hemodynamically stable, presenting with fever, tachycardia, and tachypnea. With concern for infection in his abdomen he technically meets sepsis criteria. - Hemodynamically stable, admit to [place] - Interesting history of abdominal pain. He states that this has been occurring off and on since his 54s. After thorough chart review I see that he's been seen in the ER or an office setting at least 3 separate times in the last 4 years for abdominal pain which were mostly attributed to GERD and now H pylori. - Is reasonable to consider an occult parasitic infection given the onset of the [place] of [place], however he has no eosinophils in his symptoms have been occurring over a period of several years with exacerbations less than once per year which I feel makes this unlikely. - CT findings with suspicion of colitis - Treat for presumptive colitis given his initially elevated lactic acid and fever with Cipro and Flagyl - Initial lactic acid 4.8, repeat is 1.75 which is reassuring - Clear liquid diet, advance carefully - monitor closely with serial abdominal exams - Repeat CBC tomorrow morning, no leukocytosis today - Urine and blood cultures pending - IV morphine ordered for pain - Monitor clinically  Thalassemia - Hemoglobin 12.4 today, monitor  H. Pylori infection - Patient  was started on treatment by his PCP, is not being compliant with the antibiotics. - Consider emphasizing importance of treatment the patient on discharge  FEN/GI: IV fluids, normal saline at 123ml/hr for 24 hours, clear liquid diet, Prophylaxis: Subcutaneous heparin  Disposition: Admit to med surge for IV antibiotics and serial abdominal exams  History of Present Illness: Kyle Cooke is a 54 y.o. male presenting with sudden onset abdominal pain, fever, emesis, and diarrhea. Using his daughter as a Optometrist he and she explained that he began having symptoms suddenly around midnight. He had had diarrhea for a few hours before that but his abdominal pain and emesis started suddenly around midnight. His abdominal pain and emesis were so severe they came straight to the emergency room. Since arriving he feels that her with improving abdominal pain and nausea, but still feels unwell.   He describes his emesis as nonbloody and nonbilious. He states that he's had similar pain which has been less severe intermittently since his 53s. He and his family explained that he's been to the ER several times for this and mostly been told that it's GERD. They question that he had some food that was a week-old they described as spicy pork that was barbecued and eaten with fresh rice, however no one else eating the food has been ill.  He denies change in appetite, chest pain, dyspnea, runny nose, sore throat, dysuria, melena, and hematochezia  Review Of Systems: Per HPI, Otherwise 12 point review of systems was performed and was unremarkable.  Patient Active Problem List   Diagnosis Date Noted  . Colitis 08/04/2014  . Abdominal pain 06/02/2014  . Microcytic anemia 05/10/2010  Past Medical History: Past Medical History  Diagnosis Date  . Hypokalemia   . Atypical chest pain      Noncardiac in etiology with negative cardiac enzymes 2-D echo and EKG during December 2011 admission  . Anemia   . Hemoglobin H constant  spring variant   . Vitamin D deficiency    Past Surgical History: History reviewed. No pertinent past surgical history. Social History: History  Substance Use Topics  . Smoking status: Never Smoker   . Smokeless tobacco: Not on file  . Alcohol Use: No   Additional social history: Please also refer to relevant sections of EMR.  Family History: History reviewed. No pertinent family history. Allergies and Medications: No Known Allergies No current facility-administered medications on file prior to encounter.   Current Outpatient Prescriptions on File Prior to Encounter  Medication Sig Dispense Refill  . omeprazole (PRILOSEC) 20 MG capsule Take 1 capsule (20 mg total) by mouth 2 (two) times daily before a meal. 30 capsule 0  . amoxicillin (AMOXIL) 500 MG tablet Take 2 tablets (1,000 mg total) by mouth 2 (two) times daily. (Patient not taking: Reported on 08/04/2014) 40 tablet 0  . clarithromycin (BIAXIN) 500 MG tablet Take 1 tablet (500 mg total) by mouth 2 (two) times daily. (Patient not taking: Reported on 08/04/2014) 20 tablet 0    Objective: BP 110/64 mmHg  Pulse 94  Temp(Src) 100.1 F (37.8 C) (Oral)  Resp 20  SpO2 99% Exam: Gen: NAD, alert, cooperative with exam, uncomfortable appearing, slightly diaphoretic HEENT: NCAT, EOMI, MMM CV: RRR, good S1/S2, no murmur Resp: CTABL, no wheezes, non-labored Abd: Slightly tense throughout, moderate tenderness to palpation in all quadrants, no rebound Ext: No edema, 2+ DP pulses Neuro: Alert and oriented, strength 5/5 in bilateral lower extremities, 4/5 in bilateral upper extremities, EOMI, conversational in his language and understanding some English  Labs and Imaging: CBC BMET   Recent Labs Lab 08/04/14 0340 08/04/14 0420  WBC 9.5  --   HGB 12.4* 14.3  HCT 38.0* 42.0  PLT 157  --     Recent Labs Lab 08/04/14 0340 08/04/14 0420  NA 138 139  K 3.7 3.8  CL 106 105  CO2 20  --   BUN 16 19  CREATININE 1.24 1.00   GLUCOSE 111* 104*  CALCIUM 9.4  --      UA: 15 ketones, negative nitrites and leukocyte esterase, negative blood Lactic acid 4.86, repeat 1.753 hours later Lipase 44 AST 33 ALT 25  CT abd 08/04/2014 IMPRESSION: 1. Equivocal colonic wall thickening involving the transverse and descending colon versus nondistention. This may reflect mild colitis. No bowel dilatation. 2. Mild splenomegaly. 3. Incidental findings of hemangioma within the liver and cyst in the left kidney. Please note the complex nature of the cyst seen on prior ultrasound is not appreciated by CT.  Timmothy Euler, MD 08/04/2014, 8:25 AM PGY-3, [place] Intern pager: [phone], text pages welcome

## 2014-08-04 NOTE — ED Notes (Addendum)
Daughter-- [phone]  Primary contact - can interpret for pt. Pt speaks [employer] .

## 2014-08-04 NOTE — Progress Notes (Signed)
S: pt. Continues to complain of abdominal pain. He has not had any more nausea / vomiting. He continues to have diarrhea. He denies bloody diarrhea. He says that it has occurred about 4 times since the onset of all of his symptoms. He says that it is not watery. He has a difficult time explaining his symptoms and we were not able to obtain an interpreter for this discussion. He did say that his daughter who speaks excellent English will be returning at 8 pm and would like to continue the discussion then. His abdominal pain is improving somewhat.   O:  Filed Vitals:   08/04/14 0824  BP: 110/64  Pulse: 94  Temp: 100.1 F (37.8 C)  Resp: 20   Gen: NAD, AAOx3, Resting comfortably HEENT: NCAT, PERRLA, EOMI CV: RRR, No MGR, Normal S1/S2 Resp: CTA Bilaterally, unlabored Abd: S, Moderate TTP mostly in the LUQ and epigastrum, Hyperactive BS, No guarding, no peritoneal signs, Nondistended.  Ext: WWP, 2+ distal pulses   A/P: Kyle Cooke is a 54 y.o. male presenting with abdominal pain, diarrhea, emesis, and fever with CT findings suspicious for colitis . PMH is significant for recurrent abdominal pain, thalassemia, H pylori infection. Known H. Pylori and epigastric tenderness with hyperactive bowel sounds and diarrhea here. Nonbloody diarrhea. May be a component of Gastritis 2/2 h. Pylori with acute viral gastroenteritis resulting in the abdominal pain that he feels. History of repeated episodes however without evidence of bloody stools. Unclear how often he has diarrhea.  - Will start workup for IBD given recurrence of these symptoms.  - Fecal lactoferrin, Iron, B12, Folate, ESR, CRP, Hemeoccult - Stool cultures - C.Diff - Supportive Care with IVF  - Regular diet - F/U improvement in abdominal pain  - Trending BMEt, CBC.  - Lactate trended down from 4 > 1

## 2014-08-04 NOTE — ED Notes (Signed)
Show lactic acid resaults to dr.yoa

## 2014-08-04 NOTE — Progress Notes (Signed)
Utilization Review Completed.Donne Anon T3/03/2015

## 2014-08-04 NOTE — ED Provider Notes (Signed)
CSN: [id number]     Arrival date & time 08/04/14  0259 History  This chart was scribed for Wandra Arthurs, MD by Eustaquio Maize, ED Scribe. This patient was seen in room B19C/B19C and the patient's care was started at 3:19 AM.   Chief Complaint  Patient presents with  . Abdominal Pain    The history is provided by the patient and a relative. The history is limited by a language barrier. No language interpreter was used.   HPI Comments:   Kyle Cooke is a 54 y.o. male who presents to the Emergency Department complaining of abdominal pain that began tonight around midnight (approximately 3 hours ago). Family reports that the pain radiates to the back as well. Pt also complains of fever, chills, nausea, vomiting, diarrhea. Denies eating anything unusual yesterday. Pt also denies cough, dysuria, or any other symptoms.      Past Medical History  Diagnosis Date  . Hypokalemia   . Atypical chest pain      Noncardiac in etiology with negative cardiac enzymes 2-D echo and EKG during December 2011 admission  . Anemia   . Hemoglobin H constant spring variant   . Vitamin D deficiency    History reviewed. No pertinent past surgical history. History reviewed. No pertinent family history. History  Substance Use Topics  . Smoking status: Never Smoker   . Smokeless tobacco: Not on file  . Alcohol Use: No    Review of Systems  Constitutional: Positive for fever and chills.  Respiratory: Negative for cough.   Gastrointestinal: Positive for nausea, vomiting, abdominal pain and diarrhea.  Genitourinary: Negative for dysuria.  Musculoskeletal: Positive for back pain.  All other systems reviewed and are negative.     Allergies  Review of patient's allergies indicates no known allergies.  Home Medications   Prior to Admission medications   Medication Sig Start Date End Date Taking? Authorizing Provider  omeprazole (PRILOSEC) 20 MG capsule Take 1 capsule (20 mg total) by mouth 2 (two) times daily  before a meal. 06/02/14  Yes Rosemarie Ax, MD  amoxicillin (AMOXIL) 500 MG tablet Take 2 tablets (1,000 mg total) by mouth 2 (two) times daily. Patient not taking: Reported on 08/04/2014 06/02/14   Rosemarie Ax, MD  clarithromycin (BIAXIN) 500 MG tablet Take 1 tablet (500 mg total) by mouth 2 (two) times daily. Patient not taking: Reported on 08/04/2014 06/02/14   Rosemarie Ax, MD   Triage Vitals: BP 138/80 mmHg  Pulse 102  Temp(Src) 98.4 F (36.9 C) (Oral)  Resp 32  SpO2 100%   Physical Exam  Constitutional: He is oriented to person, place, and time. He appears well-developed and well-nourished.  Uncomfortable on exam.   HENT:  Head: Normocephalic and atraumatic.  Mouth/Throat: Mucous membranes are dry.  Eyes: Conjunctivae and EOM are normal.  Neck: Neck supple. No tracheal deviation present.  Cardiovascular: Tachycardia present.   Pulmonary/Chest: Effort normal and breath sounds normal. No respiratory distress.  Abdominal: There is tenderness (Epigastric region. ).  Musculoskeletal: Normal range of motion.  Neurological: He is alert and oriented to person, place, and time.  Skin: Skin is warm and dry.  Psychiatric: He has a normal mood and affect. His behavior is normal.  Nursing note and vitals reviewed.   ED Course  Procedures (including critical care time)  DIAGNOSTIC STUDIES: Oxygen Saturation is 100% on RA, normal by my interpretation.    COORDINATION OF CARE: 3:23 AM-Discussed treatment plan which includes pain medication with  pt at bedside and pt agreed to plan.   Labs Review Labs Reviewed  CBC WITH DIFFERENTIAL/PLATELET - Abnormal; Notable for the following:    Hemoglobin 12.4 (*)    HCT 38.0 (*)    MCV 72.4 (*)    MCH 23.6 (*)    Neutrophils Relative % 88 (*)    Neutro Abs 8.3 (*)    Lymphocytes Relative 6 (*)    Lymphs Abs 0.6 (*)    All other components within normal limits  COMPREHENSIVE METABOLIC PANEL - Abnormal; Notable for the following:     Glucose, Bld 111 (*)    Total Bilirubin 1.9 (*)    GFR calc non Af Amer 64 (*)    GFR calc Af Amer 74 (*)    All other components within normal limits  URINALYSIS, ROUTINE W REFLEX MICROSCOPIC - Abnormal; Notable for the following:    Specific Gravity, Urine 1.033 (*)    pH 8.5 (*)    Ketones, ur 15 (*)    All other components within normal limits  I-STAT CG4 LACTIC ACID, ED - Abnormal; Notable for the following:    Lactic Acid, Venous 4.86 (*)    All other components within normal limits  I-STAT CHEM 8, ED - Abnormal; Notable for the following:    Glucose, Bld 104 (*)    Calcium, Ion 1.11 (*)    All other components within normal limits  URINE CULTURE  CULTURE, BLOOD (ROUTINE X 2)  CULTURE, BLOOD (ROUTINE X 2)  LIPASE, BLOOD  I-STAT CG4 LACTIC ACID, ED    Imaging Review Ct Abdomen Pelvis W Contrast  08/04/2014   CLINICAL DATA:  Severe generalized abdominal pain radiating into the back. Nausea, vomiting, diarrhea and fever. Symptoms for 5 hours.  EXAM: CT ABDOMEN AND PELVIS WITH CONTRAST  TECHNIQUE: Multidetector CT imaging of the abdomen and pelvis was performed using the standard protocol following bolus administration of intravenous contrast.  CONTRAST:  134mL OMNIPAQUE IOHEXOL 300 MG/ML  SOLN  COMPARISON:  Radiographs earlier this day. Abdominal ultrasound 12/30/2012  FINDINGS: Mild motion artifact at the lung bases. No consolidation or pleural effusion.  Gallbladder is physiologically distended. There is a 1.6 x 1.1 cm enhancing lesion in the caudate adjacent to the IVC the demonstrates fill in on delayed phase and is consistent with hemangioma. No additional hepatic lesions.  There is smooth splenomegaly, spleen measures 14.1 x 11.8 x 6.6 cm for a volume of 667 mL. The pancreas and adrenal glands are normal.  The kidneys demonstrate symmetric enhancement and excretion. Cyst in the left kidney measures 1.2 x 1.0 x 0.9 cm, slightly diminished in size from prior ultrasound. There is no  additional tiny cyst in the lower left kidney. No hydronephrosis.  Stomach is distended with ingested contrast. There is contrast opacifying bowel loops in the upper abdomen. No bowel dilatation to suggest obstruction. There is fluid distending scattered small bowel loops in the pelvis. Small volume of colonic stool. Equivocal wall thickening of the transverse and descending colon versus nondistention. The appendix is normal.  The abdominal aorta is normal in caliber. There is no retroperitoneal adenopathy. No free air, free fluid, or intra-abdominal fluid collection.  In the pelvis the bladder is physiologically distended. Prostate gland is normal in size. There is no pelvic free fluid.  No acute or suspicious osseous abnormality. Mild degenerative change in the lumbar spine. There is a bone island in the right femur. Vertebral body hemangioma within L1.  IMPRESSION: 1. Equivocal colonic  wall thickening involving the transverse and descending colon versus nondistention. This may reflect mild colitis. No bowel dilatation. 2. Mild splenomegaly. 3. Incidental findings of hemangioma within the liver and cyst in the left kidney. Please note the complex nature of the cyst seen on prior ultrasound is not appreciated by CT.   Electronically Signed   By: Jeb Levering M.D.   On: 08/04/2014 05:40   Dg Abd Acute W/chest  08/04/2014   CLINICAL DATA:  Abdominal pain.  EXAM: ACUTE ABDOMEN SERIES (ABDOMEN 2 VIEW & CHEST 1 VIEW)  COMPARISON:  Chest radiographs 05/14/2014  FINDINGS: Lower lung volumes from prior exam. The cardiomediastinal contours are normal. The lungs are clear. There is no free intra-abdominal air. There is air within prominent small bowel loops in the central abdomen with a few scattered air-fluid levels. Small amount of high-density fluid in the stomach likely related to contrast for planned CT. Small volume of stool throughout the colon. No radiopaque calculi. No acute osseous abnormalities are seen.   IMPRESSION: No free air. Prominent air-filled small bowel loops in the central abdomen with scattered air-fluid levels. This may reflect enteritis or ileus, early bowel obstruction is felt less likely.   Electronically Signed   By: Jeb Levering M.D.   On: 08/04/2014 05:03     EKG Interpretation None      MDM   Final diagnoses:  Abdominal pain   Kyle Cooke is a 54 y.o. male here with fever, ab pain. Has very tender epigastric area. Consider perforated ulcer vs colitis vs SBO. Will do sepsis workup.   6:29 AM Lactate 4.8. Given 2 L NS. Acute abdominal series showed no free air. CT showed mild colitis. Given elevated lactate, will start abx. Will admit to med/surg.   I personally performed the services described in this documentation, which was scribed in my presence. The recorded information has been reviewed and is accurate.     Wandra Arthurs, MD 08/04/14 0630

## 2014-08-04 NOTE — ED Notes (Signed)
Per pt's family, pt started having severe abdominal pain with nausea, vomiting and diarrhea at midnight tonight.

## 2014-08-05 DIAGNOSIS — R0782 Intercostal pain: Secondary | ICD-10-CM

## 2014-08-05 DIAGNOSIS — K529 Noninfective gastroenteritis and colitis, unspecified: Secondary | ICD-10-CM

## 2014-08-05 DIAGNOSIS — R1084 Generalized abdominal pain: Secondary | ICD-10-CM

## 2014-08-05 LAB — BASIC METABOLIC PANEL
ANION GAP: 9 (ref 5–15)
BUN: 9 mg/dL (ref 6–23)
CO2: 23 mmol/L (ref 19–32)
Calcium: 8.2 mg/dL — ABNORMAL LOW (ref 8.4–10.5)
Chloride: 107 mmol/L (ref 96–112)
Creatinine, Ser: 1 mg/dL (ref 0.50–1.35)
GFR, EST NON AFRICAN AMERICAN: 83 mL/min — AB (ref >90)
GLUCOSE: 118 mg/dL — AB (ref 70–99)
Potassium: 3 mmol/L — ABNORMAL LOW (ref 3.5–5.1)
SODIUM: 139 mmol/L (ref 135–145)

## 2014-08-05 LAB — CBC
HCT: 31 % — ABNORMAL LOW (ref 39.0–52.0)
HEMOGLOBIN: 9.7 g/dL — AB (ref 13.0–17.0)
MCH: 22.7 pg — AB (ref 26.0–34.0)
MCHC: 31.3 g/dL (ref 30.0–36.0)
MCV: 72.4 fL — AB (ref 78.0–100.0)
Platelets: 105 10*3/uL — ABNORMAL LOW (ref 150–400)
RBC: 4.28 MIL/uL (ref 4.22–5.81)
RDW: 15.3 % (ref 11.5–15.5)
WBC: 5 10*3/uL (ref 4.0–10.5)

## 2014-08-05 LAB — FECAL LACTOFERRIN, QUANT: FECAL LACTOFERRIN: POSITIVE

## 2014-08-05 LAB — TROPONIN I
Troponin I: <0.03 ng/mL (ref <0.031)
Troponin I: <0.03 ng/mL (ref <0.031)

## 2014-08-05 MED ORDER — POTASSIUM CHLORIDE CRYS ER 20 MEQ PO TBCR: 40.0000 meq | EXTENDED_RELEASE_TABLET | Freq: Two times a day (BID) | ORAL | Status: DC

## 2014-08-05 MED ORDER — KETOROLAC TROMETHAMINE 30 MG/ML IJ SOLN
30.0000 mg | Freq: Once | INTRAMUSCULAR | Status: AC
  Administered 2014-08-05: 30 mg via INTRAVENOUS
  Filled 2014-08-05: qty 1

## 2014-08-05 MED ORDER — SODIUM CHLORIDE 0.9 % IV SOLN
INTRAVENOUS | Status: DC
  Administered 2014-08-05: 1000 mL via INTRAVENOUS
  Administered 2014-08-06: 16:00:00 via INTRAVENOUS

## 2014-08-05 MED ORDER — POTASSIUM CHLORIDE CRYS ER 20 MEQ PO TBCR
40.0000 meq | EXTENDED_RELEASE_TABLET | Freq: Two times a day (BID) | ORAL | Status: DC
  Administered 2014-08-05 – 2014-08-07 (×5): 40 meq via ORAL
  Filled 2014-08-05 (×8): qty 2

## 2014-08-05 NOTE — Progress Notes (Signed)
Family Medicine Teaching Service Daily Progress Note Intern Pager: [phone]  Patient name: Kyle Cooke Medical record number: [id number] Date of birth: 1960-05-29 Age: 54 y.o. Gender: male  Primary Care Provider: No PCP Per Patient Consultants: None  Code Status: Full   Pt Overview and Major Events to Date:  3/11 admitted med-surg 3/11: cipro>>          Flagyl>> 3/12: telemetry secondary to atypical CP. EKG > ? t wave inversion           trop cycled>>  Assessment and Plan: Kyle Cooke is a 54 y.o. male presenting with abdominal pain, diarrhea, emesis, and fever with CT findings suspicious for colitis. PMH is significant for recurrent abdominal pain, thalassemia, H pylori infection.  Sirs Syndrome, Abdominal pain- Colitis: Patient hemodynamically stable, presenting with fever, tachycardia, and tachypnea. With concern for infection in his abdomen he meets sepsis criteria. Abdominal pain  has been occurring off and on since his 33s. He's been seen in the ER or an office setting at least 3 separate times in the last 4 years for abdominal pain which were mostly attributed to GERD and now H pylori. CT findings with suspicion of colitis. - Is reasonable to consider an occult parasitic infection given the onset of the [place] of [place], however he has no eosinophils in his symptoms have been occurring over a period of several years with exacerbations less than once per year which I feel makes this unlikely. - C.diff negative; stool studies pending.  - Treat for colitis given his initially elevated lactic acid and fever with Cipro and Flagyl; Initial lactic acid 4.8, repeat is 1.75 which is reassuring - tolerating solid food today, advised him to take it slow and advance as tolerated.  - Urine and blood cultures pending - IV morphine ordered for pain  Chest pain: Pt complained of left sided chest pain on 2nd day of admission. Pt has no prior cardiac history. Chest pain reproducible and  likely MSK in nature, poss pericarditis.  - XFG:HWEX T wave inversions/changes not present on prior ekg.  - EKG PRN for chest pain and in the AM - Trop cycled. - telemetry ordered  Hypokalemia: pt with potassium of 3.0 this morning.  - KDUR 40 mg BID x3 doses. (may crush in applesauce if needed) - Repeat BMP in the am  Thalassemia - Hemoglobin 12.4>> 9.7 today, likely was hemoconcentrated prior.  - Continue to monitor  H. Pylori infection  - Patient was started on treatment by his PCP, is not being compliant with the antibiotics. - Consider emphasizing importance of treatment the patient on discharge  FEN/GI: IV fluids, normal saline at 111ml/hr for 24 hours, advance diet as tolerated.  Prophylaxis: Subcutaneous heparin  Disposition: Admit to med surge>>telemetry for IV antibiotics and serial abdominal exams  Subjective: Family at bedside aided in translating.  Patient with acute chest pain this morning. EKG with mild T wave inversions/changes compared to prior. Chest pain is reproducible. He continues to have watery diarrhea x1 today, no foul odor or melena. He is tolerating small amounts of solid food today (egg, bread and banana).  Objective: Temp:  [98.8 F (37.1 C)-101.2 F (38.4 C)] 98.8 F (37.1 C) (03/12 0556) Pulse Rate:  [81-108] 81 (03/12 0556) Resp:  [18-21] 18 (03/12 0556) BP: (98-125)/(58-69) 98/58 mmHg (03/12 0556) SpO2:  [96 %-98 %] 98 % (03/12 0556) Physical Exam: Gen: NAD, alert, cooperative with exam, uncomfortable appearing HEENT: NCAT, EOMI, MMM CV: RRR, good S1/S2,  no murmur Resp: CTABL, no wheezes, non-labored Abd: Soft, ND, moderate tenderness to palpation left side of abd , no rebound, mild guarding.  Ext: No edema, 2+ DP pulses Neuro: Alert and oriented, strength 5/5 in bilateral lower extremities, 4/5 in bilateral upper extremities, EOMI, conversational in his language and understanding some English  Laboratory:  Recent Labs Lab 08/04/14 0340  08/04/14 0420 08/05/14 0552  WBC 9.5  --  5.0  HGB 12.4* 14.3 9.7*  HCT 38.0* 42.0 31.0*  PLT 157  --  105*    Recent Labs Lab 08/04/14 0340 08/04/14 0420 08/05/14 0552  NA 138 139 139  K 3.7 3.8 3.0*  CL 106 105 107  CO2 20  --  23  BUN 16 19 9   CREATININE 1.24 1.00 1.00  CALCIUM 9.4  --  8.2*  PROT 7.4  --   --   BILITOT 1.9*  --   --   ALKPHOS 73  --   --   ALT 25  --   --   AST 33  --   --   GLUCOSE 111* 104* 118*      Imaging/Diagnostic Tests: Ct Abdomen Pelvis W Contrast  08/04/2014   CLINICAL DATA:  Severe generalized abdominal pain radiating into the back. Nausea, vomiting, diarrhea and fever. Symptoms for 5 hours.  EXAM: CT ABDOMEN AND PELVIS WITH CONTRAST  TECHNIQUE: Multidetector CT imaging of the abdomen and pelvis was performed using the standard protocol following bolus administration of intravenous contrast.  CONTRAST:  115mL OMNIPAQUE IOHEXOL 300 MG/ML  SOLN  COMPARISON:  Radiographs earlier this day. Abdominal ultrasound 12/30/2012  FINDINGS: Mild motion artifact at the lung bases. No consolidation or pleural effusion.  Gallbladder is physiologically distended. There is a 1.6 x 1.1 cm enhancing lesion in the caudate adjacent to the IVC the demonstrates fill in on delayed phase and is consistent with hemangioma. No additional hepatic lesions.  There is smooth splenomegaly, spleen measures 14.1 x 11.8 x 6.6 cm for a volume of 667 mL. The pancreas and adrenal glands are normal.  The kidneys demonstrate symmetric enhancement and excretion. Cyst in the left kidney measures 1.2 x 1.0 x 0.9 cm, slightly diminished in size from prior ultrasound. There is no additional tiny cyst in the lower left kidney. No hydronephrosis.  Stomach is distended with ingested contrast. There is contrast opacifying bowel loops in the upper abdomen. No bowel dilatation to suggest obstruction. There is fluid distending scattered small bowel loops in the pelvis. Small volume of colonic stool.  Equivocal wall thickening of the transverse and descending colon versus nondistention. The appendix is normal.  The abdominal aorta is normal in caliber. There is no retroperitoneal adenopathy. No free air, free fluid, or intra-abdominal fluid collection.  In the pelvis the bladder is physiologically distended. Prostate gland is normal in size. There is no pelvic free fluid.  No acute or suspicious osseous abnormality. Mild degenerative change in the lumbar spine. There is a bone island in the right femur. Vertebral body hemangioma within L1.  IMPRESSION: 1. Equivocal colonic wall thickening involving the transverse and descending colon versus nondistention. This may reflect mild colitis. No bowel dilatation. 2. Mild splenomegaly. 3. Incidental findings of hemangioma within the liver and cyst in the left kidney. Please note the complex nature of the cyst seen on prior ultrasound is not appreciated by CT.   Electronically Signed   By: Jeb Levering M.D.   On: 08/04/2014 05:40   Dg Abd Acute W/chest  08/04/2014   CLINICAL DATA:  Abdominal pain.  EXAM: ACUTE ABDOMEN SERIES (ABDOMEN 2 VIEW & CHEST 1 VIEW)  COMPARISON:  Chest radiographs 05/14/2014  FINDINGS: Lower lung volumes from prior exam. The cardiomediastinal contours are normal. The lungs are clear. There is no free intra-abdominal air. There is air within prominent small bowel loops in the central abdomen with a few scattered air-fluid levels. Small amount of high-density fluid in the stomach likely related to contrast for planned CT. Small volume of stool throughout the colon. No radiopaque calculi. No acute osseous abnormalities are seen.  IMPRESSION: No free air. Prominent air-filled small bowel loops in the central abdomen with scattered air-fluid levels. This may reflect enteritis or ileus, early bowel obstruction is felt less likely.   Electronically Signed   By: Jeb Levering M.D.   On: 08/04/2014 05:03     Ma Hillock, DO 08/05/2014, 9:14  AM PGY-3, [place] Intern pager: [phone], text pages welcome

## 2014-08-06 DIAGNOSIS — R1032 Left lower quadrant pain: Secondary | ICD-10-CM

## 2014-08-06 LAB — BASIC METABOLIC PANEL
Anion gap: 4 — ABNORMAL LOW (ref 5–15)
BUN: 7 mg/dL (ref 6–23)
CO2: 25 mmol/L (ref 19–32)
Calcium: 8.5 mg/dL (ref 8.4–10.5)
Chloride: 112 mmol/L (ref 96–112)
Creatinine, Ser: 0.91 mg/dL (ref 0.50–1.35)
GFR calc Af Amer: >90 mL/min (ref >90)
Glucose, Bld: 107 mg/dL — ABNORMAL HIGH (ref 70–99)
POTASSIUM: 3.9 mmol/L (ref 3.5–5.1)
Sodium: 141 mmol/L (ref 135–145)

## 2014-08-06 LAB — CBC WITH DIFFERENTIAL/PLATELET
BASOS PCT: 1 % (ref 0–1)
Basophils Absolute: 0 10*3/uL (ref 0.0–0.1)
Eosinophils Absolute: 0 10*3/uL (ref 0.0–0.7)
Eosinophils Relative: 0 % (ref 0–5)
HCT: 32 % — ABNORMAL LOW (ref 39.0–52.0)
HEMOGLOBIN: 9.9 g/dL — AB (ref 13.0–17.0)
LYMPHS ABS: 0.6 10*3/uL — AB (ref 0.7–4.0)
Lymphocytes Relative: 16 % (ref 12–46)
MCH: 22.5 pg — AB (ref 26.0–34.0)
MCHC: 30.9 g/dL (ref 30.0–36.0)
MCV: 72.7 fL — AB (ref 78.0–100.0)
MONOS PCT: 12 % (ref 3–12)
Monocytes Absolute: 0.4 10*3/uL (ref 0.1–1.0)
NEUTROS PCT: 72 % (ref 43–77)
Neutro Abs: 2.7 10*3/uL (ref 1.7–7.7)
Platelets: 144 10*3/uL — ABNORMAL LOW (ref 150–400)
RBC: 4.4 MIL/uL (ref 4.22–5.81)
RDW: 15.1 % (ref 11.5–15.5)
WBC: 3.8 10*3/uL — ABNORMAL LOW (ref 4.0–10.5)

## 2014-08-06 LAB — URINE CULTURE: Colony Count: 10000

## 2014-08-06 LAB — RETICULOCYTES
RBC.: 4.42 MIL/uL (ref 4.22–5.81)
RETIC COUNT ABSOLUTE: 207.7 10*3/uL — AB (ref 19.0–186.0)
RETIC CT PCT: 4.7 % — AB (ref 0.4–3.1)

## 2014-08-06 LAB — TROPONIN I

## 2014-08-06 LAB — OCCULT BLOOD X 1 CARD TO LAB, STOOL: FECAL OCCULT BLD: POSITIVE — AB

## 2014-08-06 MED ORDER — ENSURE COMPLETE PO LIQD
237.0000 mL | Freq: Two times a day (BID) | ORAL | Status: DC
  Administered 2014-08-06 (×2): 237 mL via ORAL

## 2014-08-06 MED ORDER — METRONIDAZOLE 500 MG PO TABS
500.0000 mg | ORAL_TABLET | Freq: Three times a day (TID) | ORAL | Status: DC
  Administered 2014-08-06 – 2014-08-07 (×3): 500 mg via ORAL
  Filled 2014-08-06 (×5): qty 1

## 2014-08-06 MED ORDER — CIPROFLOXACIN HCL 500 MG PO TABS
500.0000 mg | ORAL_TABLET | Freq: Two times a day (BID) | ORAL | Status: DC
  Administered 2014-08-06 – 2014-08-07 (×2): 500 mg via ORAL
  Filled 2014-08-06 (×4): qty 1

## 2014-08-06 NOTE — Progress Notes (Addendum)
Pt c/o abdominal pain, RN called (pt daughter for interpretation) pt also c/o of being "tired" and short of breath and having abdominal pain. Lungs clear 02 sats 100%. Pt daughter request MD/Nurses to "figure out what is wrong with her father". MD paged. Pt refused pain med when RN brought med back to room, states "no pain" states he is "dizzy" "tired". MD paged again/made aware.

## 2014-08-06 NOTE — Progress Notes (Signed)
Family Medicine Teaching Service Daily Progress Note Intern Pager: [phone]  Patient name: Kyle Cooke Medical record number: [id number] Date of birth: [birth year] Age: 54 y.o. Gender: male  Primary Care Provider: No PCP Per Patient Consultants: None  Code Status: Full   Pt Overview and Major Events to Date:  3/11 admitted med-surg 3/11: cipro>>          Flagyl>> 3/12: telemetry secondary to atypical CP. EKG > ? t wave inversion           trop cycled>> 3/13: transition to PO abx  Assessment and Plan: Kyle Cooke is a 54 y.o. male presenting with abdominal pain, diarrhea, emesis, and fever with CT findings suspicious for colitis. PMH is significant for recurrent abdominal pain, thalassemia, H pylori infection.  Sirs Syndrome, Abdominal pain- Colitis: Patient hemodynamically stable, presenting with fever, tachycardia, and tachypnea. With concern for infection in his abdomen he meets sepsis criteria. Abdominal pain  has been occurring off and on since his 51s. He's been seen in the ER or an office setting at least 3 separate times in the last 4 years for abdominal pain which were mostly attributed to GERD and now H pylori. CT findings with suspicion of colitis. - Is reasonable to consider an occult parasitic infection given the onset of the symptoms outside of this country, however he has no eosinophils in his symptoms have been occurring over a period of several years with exacerbations less than once per year which I feel makes this unlikely. - C.diff negative; stool studies pending.  - Treat for colitis given his initially elevated lactic acid and fever with Cipro and Flagyl; Initial lactic acid 4.8, repeat is 1.75 which is reassuring - tolerating solid food today, advised him to take it slow and advance as tolerated.  - Urine culture (10,000 E.coli); blood cultures NGTD - IV morphine ordered for pain  Chest pain: Pt complained of left sided chest pain on 2nd day of admission. Pt has no prior  cardiac history. Chest pain reproducible and likely MSK in nature, poss pericarditis.  - IRS:WNIO T wave inversions/changes not present on prior ekg.  - EKG PRN for chest pain and in the AM - Trop cycled. - telemetry ordered  Hypokalemia: pt with potassium of 3.0 this morning.  - KDUR 40 mg BID x3 doses. (may crush in applesauce if needed) - Repeat BMP in the am  Thalassemia - Hemoglobin 12.4>> 9.7 today, likely was hemoconcentrated prior.  - Continue to monitor  H. Pylori infection  - Patient was started on treatment by his PCP, is not being compliant with the antibiotics. - Consider emphasizing importance of treatment the patient on discharge  FEN/GI: IV fluids, normal saline at 171ml/hr for 24 hours, advance diet as tolerated.  Prophylaxis: Subcutaneous heparin  Disposition: Admit to med surge>>telemetry for IV antibiotics and serial abdominal exams  Subjective:  Family at bedside. Had many questions today. Was able to answer most of these.  Objective: Temp:  [97.7 F (36.5 C)-98.1 F (36.7 C)] 98.1 F (36.7 C) (03/13 1347) Pulse Rate:  [62-79] 79 (03/13 1347) Resp:  [16-18] 16 (03/13 1347) BP: (112-127)/(64-77) 124/76 mmHg (03/13 1347) SpO2:  [98 %-100 %] 98 % (03/13 1347) Physical Exam: Gen: NAD, alert, cooperative with exam, uncomfortable appearing HEENT: NCAT, EOMI, MMM CV: RRR, good S1/S2, no murmur Resp: CTABL, no wheezes, non-labored Abd: Soft, ND, moderate tenderness to palpation left side of abd , no rebound, mild guarding.  Ext: No edema, 2+ DP pulses Neuro:  Alert and oriented, strength 5/5 in bilateral lower extremities, 4/5 in bilateral upper extremities, EOMI, conversational in his language and understanding some English  Laboratory:  Recent Labs Lab 08/04/14 0340 08/04/14 0420 08/05/14 0552 08/06/14 0630  WBC 9.5  --  5.0 3.8*  HGB 12.4* 14.3 9.7* 9.9*  HCT 38.0* 42.0 31.0* 32.0*  PLT 157  --  105* 144*    Recent Labs Lab 08/04/14 0340  08/04/14 0420 08/05/14 0552 08/06/14 0630  NA 138 139 139 141  K 3.7 3.8 3.0* 3.9  CL 106 105 107 112  CO2 20  --  23 25  BUN 16 19 9 7   CREATININE 1.24 1.00 1.00 0.91  CALCIUM 9.4  --  8.2* 8.5  PROT 7.4  --   --   --   BILITOT 1.9*  --   --   --   ALKPHOS 73  --   --   --   ALT 25  --   --   --   AST 33  --   --   --   GLUCOSE 111* 104* 118* 107*      Imaging/Diagnostic Tests: CT abd/pelvis 08/04/14 IMPRESSION: 1. Equivocal colonic wall thickening involving the transverse and descending colon versus nondistention. This may reflect mild colitis. No bowel dilatation. 2. Mild splenomegaly. 3. Incidental findings of hemangioma within the liver and cyst in the left kidney. Please note the complex nature of the cyst seen on prior ultrasound is not appreciated by CT.   Elberta Leatherwood, MD 08/06/2014, 3:23 PM PGY-1, [place] Intern pager: [phone], text pages welcome

## 2014-08-07 LAB — CBC WITH DIFFERENTIAL/PLATELET
BASOS PCT: 1 % (ref 0–1)
Basophils Absolute: 0 10*3/uL (ref 0.0–0.1)
EOS PCT: 1 % (ref 0–5)
Eosinophils Absolute: 0 10*3/uL (ref 0.0–0.7)
HCT: 32.4 % — ABNORMAL LOW (ref 39.0–52.0)
Hemoglobin: 10 g/dL — ABNORMAL LOW (ref 13.0–17.0)
LYMPHS PCT: 31 % (ref 12–46)
Lymphs Abs: 1 10*3/uL (ref 0.7–4.0)
MCH: 22.1 pg — AB (ref 26.0–34.0)
MCHC: 30.9 g/dL (ref 30.0–36.0)
MCV: 71.7 fL — ABNORMAL LOW (ref 78.0–100.0)
Monocytes Absolute: 0.4 10*3/uL (ref 0.1–1.0)
Monocytes Relative: 11 % (ref 3–12)
Neutro Abs: 1.9 10*3/uL (ref 1.7–7.7)
Neutrophils Relative %: 56 % (ref 43–77)
Platelets: 128 10*3/uL — ABNORMAL LOW (ref 150–400)
RBC: 4.52 MIL/uL (ref 4.22–5.81)
RDW: 15.2 % (ref 11.5–15.5)
WBC: 3.3 10*3/uL — ABNORMAL LOW (ref 4.0–10.5)

## 2014-08-07 LAB — BASIC METABOLIC PANEL
Anion gap: 5 (ref 5–15)
BUN: 8 mg/dL (ref 6–23)
CO2: 29 mmol/L (ref 19–32)
CREATININE: 0.92 mg/dL (ref 0.50–1.35)
Calcium: 8.8 mg/dL (ref 8.4–10.5)
Chloride: 107 mmol/L (ref 96–112)
GFR calc non Af Amer: >90 mL/min (ref >90)
GLUCOSE: 95 mg/dL (ref 70–99)
Potassium: 4.1 mmol/L (ref 3.5–5.1)
SODIUM: 141 mmol/L (ref 135–145)

## 2014-08-07 LAB — IRON AND TIBC
Iron: 56 ug/dL (ref 42–165)
Saturation Ratios: 33 % (ref 20–55)
TIBC: 169 ug/dL — ABNORMAL LOW (ref 215–435)
UIBC: 113 ug/dL — AB (ref 125–400)

## 2014-08-07 MED ORDER — CIPROFLOXACIN HCL 500 MG PO TABS: 500.0000 mg | ORAL_TABLET | Freq: Two times a day (BID) | ORAL | Status: DC

## 2014-08-07 MED ORDER — METRONIDAZOLE 500 MG PO TABS: 500.0000 mg | ORAL_TABLET | Freq: Three times a day (TID) | ORAL | Status: DC

## 2014-08-07 NOTE — Progress Notes (Signed)
Family Medicine Teaching Service Daily Progress Note Intern Pager: [phone]  Patient name: Kyle Cooke Medical record number: [id number] Date of birth: 1960/10/26 Age: 54 y.o. Gender: male  Primary Care Provider: No PCP Per Patient Consultants: None  Code Status: Full   Pt Overview and Major Events to Date:  3/11 admitted med-surg 3/11: cipro / Flagyl  3/12: telemetry secondary to atypical CP. EKG > ? t wave inversion 3/13: transition to PO abx 3/14: D/C home on po abx,   Assessment and Plan: Kyle Cooke is a 54 y.o. male presenting with abdominal pain, diarrhea, emesis, and fever with CT findings suspicious for colitis. PMH is significant for recurrent abdominal pain, thalassemia, H pylori infection.  Sirs Syndrome, Abdominal pain- Colitis: Patient hemodynamically stable, presenting with fever, tachycardia, and tachypnea. With concern for infection in his abdomen he meets sepsis criteria. Abdominal pain  has been occurring off and on since his 97s. He's been seen in the ER or an office setting at least 3 separate times in the last 4 years for abdominal pain which were mostly attributed to GERD and now H pylori. CT findings with suspicion of colitis. - Likely D/C home today if continuing to improve on PO antibiotics for 7 days total.  - C.diff negative; stool studies pending.  - Stool culture negative so far.  - FOBT positive but given colitis this is to be expected.  - Treat for colitis given his initially elevated lactic acid and fever with Cipro and Flagyl; Initial lactic acid 4.8, repeat is 1.75 which is reassuring - Tolerating full diet  - Urine culture (10,000 E.coli); blood cultures NGTD - IV morphine ordered for pain - Outpatient referral and follow up with GI for Upper and lower endoscopy.   Chest pain: Pt complained of left sided chest pain on 2nd day of admission. Pt has no prior cardiac history. Chest pain reproducible and likely MSK in nature, poss pericarditis.  - HKV:QQVZ T wave  inversions/changes not present on prior ekg.  - EKG PRN for chest pain and in the AM. No acute changes.  - Trop cycled. Negative  Hypokalemia: pt with potassium of 3.0 this morning.  - Resolved 4.1  Thalassemia - Hemoglobin 9.9 > 12.0 - Iron studies normal here.   H. Pylori infection  - Patient was started on treatment by his PCP, is not being compliant with the antibiotics. - Consider emphasizing importance of treatment the patient on discharge  FEN/GI: IV fluids, normal saline at 184ml/hr for 24 hours, advance diet as tolerated.  Prophylaxis: Subcutaneous heparin  Disposition: Home today if improving.   Subjective:  No acute events overnight. Pt says that his abdominal pain is improving. He feels comffortable going home if continuing to improve today. Tolerating his diet. No more diarrhea. No more nausea / vomiting. No other complaints. Conversation via his daughter who speaks excellent english and who they prefer to interpret.   Objective: Temp:  [97.9 F (36.6 C)-98.1 F (36.7 C)] 98.1 F (36.7 C) (03/14 0609) Pulse Rate:  [63-79] 70 (03/14 0609) Resp:  [16-18] 16 (03/14 0609) BP: (119-134)/(74-76) 134/76 mmHg (03/14 0609) SpO2:  [98 %-100 %] 100 % (03/14 5638) Physical Exam: Gen: NAD, alert, cooperative with exam, comfortable.  HEENT: NCAT, EOMI, MMM CV: RRR, good S1/S2, no murmur Resp: CTABL, no wheezes, non-labored Abd: Soft, ND, NT to palpation today, no rebound, no guarding.  Ext: No edema, 2+ DP pulses Neuro: Alert and oriented, strength 5/5 in bilateral lower extremities, 5/5 in bilateral upper  extremities, EOMI, conversational in his language and understanding some English  Laboratory:  Recent Labs Lab 08/05/14 0552 08/06/14 0630 08/07/14 0709  WBC 5.0 3.8* 3.3*  HGB 9.7* 9.9* 10.0*  HCT 31.0* 32.0* 32.4*  PLT 105* 144* 128*    Recent Labs Lab 08/04/14 0340  08/05/14 0552 08/06/14 0630 08/07/14 0709  NA 138  < > 139 141 141  K 3.7  < > 3.0* 3.9  4.1  CL 106  < > 107 112 107  CO2 20  --  23 25 29   BUN 16  < > 9 7 8   CREATININE 1.24  < > 1.00 0.91 0.92  CALCIUM 9.4  --  8.2* 8.5 8.8  PROT 7.4  --   --   --   --   BILITOT 1.9*  --   --   --   --   ALKPHOS 73  --   --   --   --   ALT 25  --   --   --   --   AST 33  --   --   --   --   GLUCOSE 111*  < > 118* 107* 95  < > = values in this interval not displayed.    Imaging/Diagnostic Tests: CT abd/pelvis 08/04/14 IMPRESSION: 1. Equivocal colonic wall thickening involving the transverse and descending colon versus nondistention. This may reflect mild colitis. No bowel dilatation. 2. Mild splenomegaly. 3. Incidental findings of hemangioma within the liver and cyst in the left kidney. Please note the complex nature of the cyst seen on prior ultrasound is not appreciated by CT.   Aquilla Hacker, MD 08/07/2014, 12:03 PM PGY-1, [place] Intern pager: [phone], text pages welcome

## 2014-08-07 NOTE — Discharge Instructions (Signed)
Vim ???ng Tiu Ha Do Vi Rt (Viral Gastroenteritis) Vim ???ng tiu ha do vi rt cn ???c g?i l cm d? dy. Tnh tr?ng ny ?nh h??ng ??n d? dy v ???ng ru?t. N c th? gy tiu ch?y v nn m?a ??t ng?t. B?nh th??ng ko di t? 3 ??n 8 ngy. H?u h?t m?i ng??i c ?p ?ng mi?n d?ch m cu?i cng s? kh?i nhi?m vi rt. Trong khi c ?p ?ng t? nhin ny, vi rt c th? lm cho b?n r?t m?t. NGUYN NHN Nhi?u vi rt khc nhau c th? gy ra vim ???ng tiu ha, ch?ng h?n nh? rotavirus ho?c norovirus. B?n c th? b? nhi?m m?t trong nh?ng lo?i vi rt ny do tiu th? th?c ph?m ho?c n??c b? nhi?m b?n. B?n c?ng c th? b? nhi?m vi rt do dng chung d?ng c? ho?c v?t d?ng c nhn khc v?i ng??i b? b?nh ho?c do ch?m vo b? m?t b? nhi?m b?n. TRI?U CH?NG Cc tri?u ch?ng ph? bi?n nh?t l tiu ch?y v nn m?a. Nh?ng v?n ?? ny c th? khi?n cho c? th? m?t d?ch (n??c) nghim tr?ng v c? th? m?t cn b?ng mu?i (?i?n gi?i). Cc tri?u ch?ng khc c th? bao g?m:  S?t.  ?au ??u.  M?t m?i.  ?au b?ng. CH?N ?ON Chuyn gia ch?m [place] s?c kh?e th??ng c th? ch?n ?on vim ???ng tiu ha do vi rt d?a vo cc tri?u ch?ng v khm th?c th?. M?u phn c?ng c th? ???c l?y ?? xt nghi?m xem c s? hi?n di?n c?a vi rt ho?c nhi?m trng khc khng. ?I?U TR? B?nh ny th??ng t? kh?i. ?i?u tr? nh?m m?c ?ch b n??c. Cc tr??ng h?p nghim tr?ng nh?t c?a vim ???ng tiu ha do vi rt lin quan ??n nn m?a tr?m tr?ng khi?n b?n khng th? gi? ???c n??c trong c? th?. Trong nh?ng tr??ng h?p ny, ch?t l?ng ph?i ???c ??a vo c? th? thng qua m?t ???ng truy?n t?nh m?ch (IV). H??NG D?N CH?M [place] T?I NH  U?ng ?? n??c ?? gi? cho n??c ti?u trong ho?c vng nh?t. U?ng m?t l??ng nh? ch?t l?ng th??ng xuyn v t?ng ln theo kh? n?ng dung n?p.  H?i chuyn gia ch?m [place] s?c kh?e ?? ???c h??ng d?n b n??c c? th?.  Trnh:  Th?c ph?m c hm l??ng ???ng cao.  R??u.  ?? u?ng c ga.  Thu?c l.  N??c p tri cy.  ?? u?ng c caffeine.  Ch?t l?ng c?c  nng ho?c l?nh.  Th?c ph?m bo, c nhi?u d?u.  ?n ho?c u?ng b?t c? th? g qu nhi?u m?t lc.  S?n ph?m t? s?a cho ??n 24 - 48 gi? sau khi d?ng tiu ch?y.  B?n c th? tiu th? cc ch? ph?m sinh h?c. Probiotics l s? nui c?y vi khu?n c l?i ch? ??ng. Chng c th? lm gi?m b?t l??ng phn v s? l?n tiu ch?y ? ng??i l?n. Probiotics c th? ???c tm th?y trong s?a chua v?i nh?ng nui c?y ch? ??ng v cc ch?t b? sung.  R?a tay k? ?? trnh ly lan vi rt.  Ch? s? d?ng thu?c khng c?n k toa ho?c thu?c c?n k toa ?? gi?m ?au, gi?m c?m gic kh ch?u ho?c h? s?t theo ch? d?n c?a chuyn gia ch?m [place] s?c kh?e c?a b?n. Khng cho tr? em s? d?ng aspirin. Khng nn dng thu?c ch?ng tiu ch?y.  Hy h?i chuyn gia ch?m  s?c kh?e c?a b?n xem c nn ti?p t?c u?ng thu?c ???c  k toa thng th??ng ho?c thu?c khng c?n k toa c?a b?n khng.  Tun th? m?i cu?c h?n khm l?i theo ch? d?n c?a chuyn gia ch?m [place] s?c kh?e. HY NGAY L?P T?C ?I KHM N?U:  B?n khng th? gi? ch?t l?ng trong ng??i.  B?n khng ?i ti?u t nh?t m?t l?n m?i 6 ??n 8 ti?ng.  B?n b? kh th?.  B?n th?y c mu trong phn ho?c ch?t nn. Phn ho?c ch?t nn c th? trng nh? b c ph.  B?n b? ?au b?ng gia t?ng ho?c ?au t?p trung ? m?t vng nh? (c?c b?).  B?n b? nn m?a ho?c tiu ch?y dai d?ng.  B?n b? s?t.  B?nh nhn l tr? d??i 3 thng tu?i v b b? s?t.  B?nh nhn l tr? trn 3 thng tu?i v b b? s?t v c cc tri?u ch?ng ko di.  B?nh nhn l tr? trn 3 thng tu?i v b b? s?t v c cc tri?u ch?ng ??t nhin tr? nn t?i t? h?n.  B?nh nhn l m?t em b v b khng c n??c m?t khi khc. ??M B?O B?N:  Hi?u cc h??ng d?n ny.  S? theo di tnh tr?ng c?a mnh.  S? yu c?u tr? gip ngay l?p t?c n?u b?n c?m th?y khng ?? ho?c tnh tr?ng tr?m tr?ng h?n. Document Released: 08/04/2011 Document Revised: 01/12/2013 [facility] Patient Information 2015 [place]. This information is not intended to replace advice given to you by your  health care provider. Make sure you discuss any questions you have with your health care provider.   Thanks for letting us take care of you!. Continue to take the antibiotics for 7 days.   Return to the ER if you have any worsening of your symptoms.   Sincerely,  Paula Compton, MD Family Medicine - PGY 1

## 2014-08-07 NOTE — H&P (Deleted)
[facility] Discharge Summary  Patient name: Kyle Cooke Medical record number: [id number] Date of birth: 04/29/1961 Age: 54 y.o. Gender: male Date of Admission: 08/04/2014  Date of Discharge: 08/07/2014 Admitting Physician: Alveda Reasons, MD  Primary Care Provider: Rosemarie Ax, MD Consultants: None  Indication for Hospitalization: Nausea / Vomiting / Abdominal pain .   Discharge Diagnoses/Problem List:  Recurrent abdominal pain- improving Thalassemia - clinically anemic H. pylori infection   Disposition: Discharge to home  Discharge Condition: Stable  Discharge Exam:  Gen: NAD, alert, cooperative with exam, comfortable.  HEENT: NCAT, EOMI, MMM CV: RRR, good S1/S2, no murmur Resp: CTABL, no wheezes, non-labored Abd: Soft, ND, NT to palpation today, no rebound, no guarding.  Ext: No edema, 2+ DP pulses Neuro: Alert and oriented, strength 5/5 in bilateral lower extremities, 5/5 in bilateral upper extremities, EOMI, conversational in his language and understanding some English  Brief Hospital Course:  Kyle Cooke is a 54 y.o. male who presented with abdominal pain, diarrhea, emesis, and fever with CT findings suspicious for colitis. PMH is significant for recurrent abdominal pain, thalassemia, H pylori infection.  In the ER, patient was found to be hemodynamic stable but CT scan with evidence of colitis and continued nausea, vomiting, diarrhea. She only had tachycardia and tachypnea. He met surgical criteria. He was admitted for IV antibiotics, fluids, electrolytes for stabilization. Workup in the hospital included C. difficile PCR which was negative. Stool cultures were negative at discharge. FOBT was positive, patient has colitis. Urine culture was negative. History was ciprofloxacin and Flagyl IV which were transitioned to by mouth for total 10 day course of antibiotics. His vital signs and abdominal pain improved over the course of his hospitalization.  He longer had any nausea vomiting or diarrhea. With his improvement, he is found to be safe and stable for discharge with close outpatient follow-up. Known H. pylori, would likely benefit from outpatient GI referral for upper and lower endoscopy given that he has never had these.  He has a known thalassemia with baseline anemia. Hemoglobin was stable throughout his hospitalization.   Issues for Follow Up:  1. Follow-up abdominal pain improvement 2. Follow-up completion of antibiotic course 3. Needs outpatient GI referral for endoscopy 4. Follow-up treatment of H. Pylori 5. Follow-up CBC and management of thalassemia 6. Follow-up stool cultures  Significant Procedures: None  Significant Labs and Imaging:   Recent Labs Lab 08/05/14 0552 08/06/14 0630 08/07/14 0709  WBC 5.0 3.8* 3.3*  HGB 9.7* 9.9* 10.0*  HCT 31.0* 32.0* 32.4*  PLT 105* 144* 128*    Recent Labs Lab 08/05/14 0552 08/06/14 0630 08/07/14 0709  NA 139 141 141  K 3.0* 3.9 4.1  CL 107 112 107  CO2 23 25 29   GLUCOSE 118* 107* 95  BUN 9 7 8   CREATININE 1.00 0.91 0.92  CALCIUM 8.2* 8.5 8.8    CT abd/pelvis 08/04/14 IMPRESSION: 1. Equivocal colonic wall thickening involving the transverse and descending colon versus nondistention. This may reflect mild colitis. No bowel dilatation. 2. Mild splenomegaly. 3. Incidental findings of hemangioma within the liver and cyst in the left kidney. Please note the complex nature of the cyst seen on prior ultrasound is not appreciated by CT.  Results/Tests Pending at Time of Discharge: Stool cultures  Discharge Medications:    Medication List    STOP taking these medications        amoxicillin 500 MG tablet  Commonly known as:  AMOXIL  clarithromycin 500 MG tablet  Commonly known as:  BIAXIN      TAKE these medications        ciprofloxacin 500 MG tablet  Commonly known as:  CIPRO  Take 1 tablet (500 mg total) by mouth 2 (two) times daily.      metroNIDAZOLE 500 MG tablet  Commonly known as:  FLAGYL  Take 1 tablet (500 mg total) by mouth 3 (three) times daily.     omeprazole 20 MG capsule  Commonly known as:  PRILOSEC  Take 1 capsule (20 mg total) by mouth 2 (two) times daily before a meal.        Discharge Instructions: Please refer to Patient Instructions section of EMR for full details.  Patient was counseled important signs and symptoms that should prompt return to medical care, changes in medications, dietary instructions, activity restrictions, and follow up appointments.   Follow-Up Appointments: Follow-up Information    Follow up with [facility]    . Schedule an appointment as soon as possible for a visit in 2 weeks.   Why:  Hospital Follow Up    Contact information:   [place] [address] [phone]      Aquilla Hacker, MD 08/11/2014, 6:18 PM PGY-1, [place]

## 2014-08-07 NOTE — Progress Notes (Signed)
Elie Goody Zackary to be D/C'd Home per MD order.  Discussed with the patient and all questions fully answered. Family at bedside and patient permission for them to translate.   VSS, Skin clean, dry and intact without evidence of skin break down, no evidence of skin tears noted. IV catheter discontinued intact. Site without signs and symptoms of complications. Dressing and pressure applied.  An After Visit Summary was printed and given to the patient. Patient received prescription.  D/c education completed with patient/family including follow up instructions, medication list, d/c activities limitations if indicated, with other d/c instructions as indicated by MD - patient able to verbalize understanding, all questions fully answered.   Patient instructed to return to ED, call 911, or call MD for any changes in condition.   Patient escorted via [place], and D/C home via private auto.  L'ESPERANCE, Gearlene Godsil C 08/07/2014 1:54 PM

## 2014-08-08 LAB — STOOL CULTURE: SPECIAL REQUESTS: NORMAL

## 2014-08-10 LAB — CULTURE, BLOOD (ROUTINE X 2)
CULTURE: NO GROWTH
Culture: NO GROWTH

## 2014-08-11 NOTE — Discharge Summary (Signed)
[facility] Discharge Summary  Patient name: Kyle Cooke record number: [id number] Date of birth: [birth year]Age: 54 y.o.Gender: male Date of Admission: 3/11/2016Date of Discharge: 08/07/2014 Admitting Physician: Alveda Reasons, MD  Primary Care Provider: Rosemarie Ax, MD Consultants: None  Indication for Hospitalization: Nausea / Vomiting / Abdominal pain .   Discharge Diagnoses/Problem List:  Recurrent abdominal pain- improving Thalassemia - clinically anemic H. pylori infection   Disposition: Discharge to home  Discharge Condition: Stable  Discharge Exam:  Gen: NAD, alert, cooperative with exam, comfortable.  HEENT: NCAT, EOMI, MMM CV: RRR, good S1/S2, no murmur Resp: CTABL, no wheezes, non-labored Abd: Soft, ND, NT to palpation today, no rebound, no guarding.  Ext: No edema, 2+ DP pulses Neuro: Alert and oriented, strength 5/5 in bilateral lower extremities, 5/5 in bilateral upper extremities, EOMI, conversational in his language and understanding some English  Brief Hospital Course:  Kyle Cooke is a 54 y.o. male who presented with abdominal pain, diarrhea, emesis, and fever with CT findings suspicious for colitis. PMH is significant for recurrent abdominal pain, thalassemia, H pylori infection.  In the ER, patient was found to be hemodynamic stable but CT scan with evidence of colitis and continued nausea, vomiting, diarrhea. She only had tachycardia and tachypnea. He met surgical criteria. He was admitted for IV antibiotics, fluids, electrolytes for stabilization. Workup in the hospital included C. difficile PCR which was negative. Stool cultures were negative at discharge. FOBT was positive, patient has colitis. Urine culture was negative. History was ciprofloxacin and Flagyl IV which were transitioned to by mouth for total 10 day course of antibiotics. His vital signs and abdominal pain improved  over the course of his hospitalization. He longer had any nausea vomiting or diarrhea. With his improvement, he is found to be safe and stable for discharge with close outpatient follow-up. Known H. pylori, would likely benefit from outpatient GI referral for upper and lower endoscopy given that he has never had these.  He has a known thalassemia with baseline anemia. Hemoglobin was stable throughout his hospitalization.   Issues for Follow Up:  1. Follow-up abdominal pain improvement 2. Follow-up completion of antibiotic course 3. Needs outpatient GI referral for endoscopy 4. Follow-up treatment of H. Pylori 5. Follow-up CBC and management of thalassemia 6. Follow-up stool cultures  Significant Procedures: None  Significant Labs and Imaging:   Last Labs      Recent Labs Lab 08/05/14 0552 08/06/14 0630 08/07/14 0709  WBC 5.0 3.8* 3.3*  HGB 9.7* 9.9* 10.0*  HCT 31.0* 32.0* 32.4*  PLT 105* 144* 128*      Last Labs      Recent Labs Lab 08/05/14 0552 08/06/14 0630 08/07/14 0709  NA 139 141 141  K 3.0* 3.9 4.1  CL 107 112 107  CO2 _0 GLUCOSE 118* 107* 95  BUN _1 CREATININE 1.00 0.91 0.92  CALCIUM 8.2* 8.5 8.8      CT abd/pelvis 08/04/14 IMPRESSION: 1. Equivocal colonic wall thickening involving the transverse and descending colon versus nondistention. This may reflect mild colitis. No bowel dilatation. 2. Mild splenomegaly. 3. Incidental findings of hemangioma within the liver and cyst in the left kidney. Please note the complex nature of the cyst seen on prior ultrasound is not appreciated by CT.  Results/Tests Pending at Time of Discharge: Stool cultures  Discharge Medications:    Medication List    STOP taking these medications       amoxicillin  500 MG tablet  Commonly known as: AMOXIL     clarithromycin 500 MG tablet  Commonly known as: BIAXIN      TAKE these  medications       ciprofloxacin 500 MG tablet  Commonly known as: CIPRO  Take 1 tablet (500 mg total) by mouth 2 (two) times daily.     metroNIDAZOLE 500 MG tablet  Commonly known as: FLAGYL  Take 1 tablet (500 mg total) by mouth 3 (three) times daily.     omeprazole 20 MG capsule  Commonly known as: PRILOSEC  Take 1 capsule (20 mg total) by mouth 2 (two) times daily before a meal.        Discharge Instructions: Please refer to Patient Instructions section of EMR for full details. Patient was counseled important signs and symptoms that should prompt return to medical care, changes in medications, dietary instructions, activity restrictions, and follow up appointments.   Follow-Up Appointments: Follow-up Information    Follow up with [facility] . Schedule an appointment as soon as possible for a visit in 2 weeks.   Why: Hospital Follow Up    Contact information:   [place] [id number] [phone]      Aquilla Hacker, MD 08/11/2014, 6:18 PM PGY-1, [place]

## 2014-08-25 ENCOUNTER — Ambulatory Visit: Payer: 59 | Admitting: Family Medicine

## 2014-09-04 NOTE — Telephone Encounter (Signed)
error 

## 2014-11-21 ENCOUNTER — Encounter: Payer: Self-pay | Admitting: Family Medicine

## 2014-11-21 ENCOUNTER — Ambulatory Visit (INDEPENDENT_AMBULATORY_CARE_PROVIDER_SITE_OTHER): Payer: 59 | Admitting: Family Medicine

## 2014-11-21 VITALS — BP 110/60 | HR 73 | Temp 98.1°F | Wt 119.0 lb

## 2014-11-21 DIAGNOSIS — R1084 Generalized abdominal pain: Secondary | ICD-10-CM

## 2014-11-21 MED ORDER — OMEPRAZOLE 20 MG PO CPDR: 20.0000 mg | DELAYED_RELEASE_CAPSULE | Freq: Two times a day (BID) | ORAL | Status: DC

## 2014-11-21 NOTE — Patient Instructions (Signed)
Thank you for coming to the clinic today. It was nice seeing you.  For your abdominal pain, it is too early to tell exactly what is going on. Please make sure you stay well hydrated while you are having diarrhea. You can take over the counter tylenol for the abdominal pain.   Please come back to the clinic in 1-2 days for recheck. Please let us know earlier if you start to have severe pain, severe diarrhea, bloody diarrhea, fevers, or vomiting.

## 2014-11-21 NOTE — Progress Notes (Signed)
   Kyle Cooke is a 54 y.o. male who presents to the [facility] today for same day appointment for abdominal pain.   HPI:  Translator offered, but declined. History provided by patient and daughter who helped with interpretation.   Abdominal Pain Started this morning after waking up. Hospitalized 3 months ago with colitis. No pain since then until now. Pain is sharp, and radiates to epigastric area. Constant in nature. Has not tried any medications. Pain is currently 9/10. No nausea or vomiting.  No blood. Pain improved after diarrhea. Ate a couple pieces of candy. Two episodes of diarrhea today. Second stool more watery. Some chills. No known sick contacts.    ROS: As per HPI, otherwise all systems reviewed and are negative.  Past Medical History - Reviewed and updated Patient Active Problem List   Diagnosis Date Noted  . Intercostal pain   . Colitis 08/04/2014  . Abdominal pain 06/02/2014  . Microcytic anemia 05/10/2010    Medications- reviewed and updated Current Outpatient Prescriptions  Medication Sig Dispense Refill  . omeprazole (PRILOSEC) 20 MG capsule Take 1 capsule (20 mg total) by mouth 2 (two) times daily before a meal. 30 capsule 0   No current facility-administered medications for this visit.    Objective: Physical Exam: BP 110/60 mmHg  Pulse 73  Temp(Src) 98.1 F (36.7 C) (Oral)  Wt 119 lb (53.978 kg)  Gen: NAD, lying on exam table CV: RRR with no murmurs appreciated Lungs: NWOB, CTAB with no crackles, wheezes, or rhonchi Abdomen: Normal bowel sounds. Soft. Mildly tender to palpation in periumbilical area and LLQ. Non distended. No rebound or guarding. Negative murphy's sign.  Ext: no edema Skin: warm, dry Neuro: grossly normal, moves all extremities A/P: See problem list  Abdominal pain Differential at this time includes infectious colitis (same presentation 3 months ago, though no current fevers), viral gastroenteritis (though no nausea or vomiting), symptomatic H  pylori (has previously tested positive, s/p treatment). Not likely c diff as patient's last course of antibiotics was 3 months ago.  At this point, it is very early in his course.  Vital signs within normal ranges and no red flag signs or symptoms. Will treat conservatively and have close follow up in 1-2 days. Will continue omeprazole. Return precautions given.   Depending on progression of symptoms, can consider empiric antibiotics or H pylori testing.     Meds ordered this encounter  Medications  . omeprazole (PRILOSEC) 20 MG capsule    Sig: Take 1 capsule (20 mg total) by mouth 2 (two) times daily before a meal.    Dispense:  30 capsule    Refill:  0     Caleb M. Jerline Pain, [facility] Resident PGY-1 11/21/2014 10:31 AM

## 2014-11-21 NOTE — Assessment & Plan Note (Addendum)
Differential at this time includes infectious colitis (same presentation 3 months ago, though no current fevers), viral gastroenteritis (though no nausea or vomiting), symptomatic H pylori (has previously tested positive, s/p treatment). Not likely c diff as patient's last course of antibiotics was 3 months ago.  At this point, it is very early in his course.  Vital signs within normal ranges and no red flag signs or symptoms. Will treat conservatively and have close follow up in 1-2 days. Will continue omeprazole. Return precautions given.   Depending on progression of symptoms, can consider empiric antibiotics or H pylori testing.

## 2015-02-02 ENCOUNTER — Encounter (HOSPITAL_COMMUNITY): Payer: Self-pay | Admitting: Emergency Medicine

## 2015-02-02 DIAGNOSIS — K529 Noninfective gastroenteritis and colitis, unspecified: Secondary | ICD-10-CM | POA: Insufficient documentation

## 2015-02-02 DIAGNOSIS — Z862 Personal history of diseases of the blood and blood-forming organs and certain disorders involving the immune mechanism: Secondary | ICD-10-CM | POA: Diagnosis not present

## 2015-02-02 DIAGNOSIS — Z79899 Other long term (current) drug therapy: Secondary | ICD-10-CM | POA: Diagnosis not present

## 2015-02-02 DIAGNOSIS — Z8639 Personal history of other endocrine, nutritional and metabolic disease: Secondary | ICD-10-CM | POA: Diagnosis not present

## 2015-02-02 DIAGNOSIS — R1084 Generalized abdominal pain: Secondary | ICD-10-CM | POA: Diagnosis present

## 2015-02-02 MED ORDER — OXYCODONE-ACETAMINOPHEN 5-325 MG PO TABS
1.0000 | ORAL_TABLET | Freq: Once | ORAL | Status: AC
  Administered 2015-02-02: 1 via ORAL

## 2015-02-02 MED ORDER — ONDANSETRON 4 MG PO TBDP
ORAL_TABLET | ORAL | Status: AC
  Filled 2015-02-02: qty 1

## 2015-02-02 MED ORDER — OXYCODONE-ACETAMINOPHEN 5-325 MG PO TABS
ORAL_TABLET | ORAL | Status: AC
  Filled 2015-02-02: qty 1

## 2015-02-02 MED ORDER — ONDANSETRON 4 MG PO TBDP
4.0000 mg | ORAL_TABLET | Freq: Once | ORAL | Status: AC
  Administered 2015-02-02: 4 mg via ORAL

## 2015-02-02 NOTE — ED Notes (Addendum)
Pt from home for eval of generalized abd pain, pt also reports nausea and diarrhea that started today. Pt reports chills and states he took tylenol with no relief. Pt also reports dysuria and back pain. Pt with hx of gallbladder issues.

## 2015-02-03 ENCOUNTER — Emergency Department (HOSPITAL_COMMUNITY): Payer: 59

## 2015-02-03 ENCOUNTER — Emergency Department (HOSPITAL_COMMUNITY)
Admission: EM | Admit: 2015-02-03 | Discharge: 2015-02-03 | Disposition: A | Discharge status: Home / Self Care | Payer: 59 | Attending: Emergency Medicine | Admitting: Emergency Medicine

## 2015-02-03 DIAGNOSIS — Z8719 Personal history of other diseases of the digestive system: Secondary | ICD-10-CM

## 2015-02-03 DIAGNOSIS — R1084 Generalized abdominal pain: Secondary | ICD-10-CM

## 2015-02-03 LAB — COMPREHENSIVE METABOLIC PANEL
ALBUMIN: 4.5 g/dL (ref 3.5–5.0)
ALT: 22 U/L (ref 17–63)
ANION GAP: 14 (ref 5–15)
AST: 34 U/L (ref 15–41)
Alkaline Phosphatase: 59 U/L (ref 38–126)
BUN: 14 mg/dL (ref 6–20)
CHLORIDE: 103 mmol/L (ref 101–111)
CO2: 18 mmol/L — ABNORMAL LOW (ref 22–32)
Calcium: 9.5 mg/dL (ref 8.9–10.3)
Creatinine, Ser: 1 mg/dL (ref 0.61–1.24)
GFR calc Af Amer: >60 mL/min (ref >60)
Glucose, Bld: 94 mg/dL (ref 65–99)
POTASSIUM: 3.7 mmol/L (ref 3.5–5.1)
Sodium: 135 mmol/L (ref 135–145)
TOTAL PROTEIN: 7.8 g/dL (ref 6.5–8.1)
Total Bilirubin: 2.7 mg/dL — ABNORMAL HIGH (ref 0.3–1.2)

## 2015-02-03 LAB — CBC
HEMATOCRIT: 39.8 % (ref 39.0–52.0)
HEMOGLOBIN: 13 g/dL (ref 13.0–17.0)
MCH: 23.9 pg — ABNORMAL LOW (ref 26.0–34.0)
MCHC: 32.7 g/dL (ref 30.0–36.0)
MCV: 73.3 fL — AB (ref 78.0–100.0)
Platelets: 180 10*3/uL (ref 150–400)
RBC: 5.43 MIL/uL (ref 4.22–5.81)
RDW: 15.3 % (ref 11.5–15.5)
WBC: 9.3 10*3/uL (ref 4.0–10.5)

## 2015-02-03 LAB — URINALYSIS, ROUTINE W REFLEX MICROSCOPIC
Glucose, UA: NEGATIVE mg/dL
Hgb urine dipstick: NEGATIVE
Ketones, ur: >80 mg/dL — AB
LEUKOCYTES UA: NEGATIVE
NITRITE: NEGATIVE
PH: 5 (ref 5.0–8.0)
Protein, ur: NEGATIVE mg/dL
Specific Gravity, Urine: 1.026 (ref 1.005–1.030)
UROBILINOGEN UA: 0.2 mg/dL (ref 0.0–1.0)

## 2015-02-03 LAB — LIPASE, BLOOD: Lipase: 36 U/L (ref 22–51)

## 2015-02-03 MED ORDER — SODIUM CHLORIDE 0.9 % IV BOLUS (SEPSIS)
1000.0000 mL | Freq: Once | INTRAVENOUS | Status: AC
  Administered 2015-02-03: 1000 mL via INTRAVENOUS

## 2015-02-03 MED ORDER — IOHEXOL 300 MG/ML  SOLN
75.0000 mL | Freq: Once | INTRAMUSCULAR | Status: AC | PRN
  Administered 2015-02-03: 75 mL via INTRAVENOUS

## 2015-02-03 MED ORDER — IOHEXOL 300 MG/ML  SOLN: 25.0000 mL | Freq: Once | INTRAMUSCULAR | Status: DC | PRN

## 2015-02-03 MED ORDER — METRONIDAZOLE 500 MG PO TABS: 500.0000 mg | ORAL_TABLET | Freq: Three times a day (TID) | ORAL | Status: DC

## 2015-02-03 NOTE — ED Notes (Signed)
To us

## 2015-02-03 NOTE — ED Provider Notes (Signed)
CSN: [id number]     Arrival date & time 02/02/15  2244 History   This chart was scribed for Kyle Speak, MD by Forrestine Him, ED Scribe. This patient was seen in room B15C/B15C and the patient's care was started 12:49 AM.   Chief Complaint  Patient presents with  . Abdominal Pain   Patient is a 54 y.o. male presenting with abdominal pain. The history is provided by a relative. No language interpreter was used.  Abdominal Pain Pain location:  Generalized Pain radiates to:  Does not radiate Pain severity:  Moderate Onset quality:  Gradual Duration:  3 days Timing:  Constant Progression:  Unchanged Chronicity:  Chronic Relieved by:  Nothing Worsened by:  Nothing tried Ineffective treatments:  NSAIDs Associated symptoms: chills and diarrhea   Associated symptoms: no chest pain, no cough, no fever, no nausea, no shortness of breath and no vomiting     HPI Comments: Kyle Cooke is a 54 y.o. male with a PMHx of colitis who presents to the Emergency Department complaining of constant, ongoing diffuse abdominal pain that is chronic in nature but worsened in the last 2-3 days. Ongoing associated cold chills and diarrhea also reported. Daughter states pt has been evaluated in the Emergency Department multiple times in the past. Most recent visit March of this year; diagnosed with colitis at time of visit. OTC Tylenol attempted prior to arrival without any improvement. However, pt reports some improvement after Percocet given in triage. No recent fever, nausea, vomiting, chest pain, or shortness of breath. No known allergies to medications.  Past Medical History  Diagnosis Date  . Hypokalemia   . Atypical chest pain      Noncardiac in etiology with negative cardiac enzymes 2-D echo and EKG during December 2011 admission  . Anemia   . Hemoglobin H constant spring variant   . Vitamin D deficiency    History reviewed. No pertinent past surgical history. No family history on file. Social History   Substance Use Topics  . Smoking status: Never Smoker   . Smokeless tobacco: None  . Alcohol Use: No    Review of Systems  Constitutional: Positive for chills. Negative for fever.  Respiratory: Negative for cough and shortness of breath.   Cardiovascular: Negative for chest pain.  Gastrointestinal: Positive for abdominal pain and diarrhea. Negative for nausea and vomiting.  Neurological: Negative for headaches.  Psychiatric/Behavioral: Negative for confusion.  All other systems reviewed and are negative.     Allergies  Review of patient's allergies indicates no known allergies.  Home Medications   Prior to Admission medications   Medication Sig Start Date End Date Taking? Authorizing Provider  omeprazole (PRILOSEC) 20 MG capsule Take 1 capsule (20 mg total) by mouth 2 (two) times daily before a meal. 11/21/14   Vivi Barrack, MD   Triage Vitals: BP 99/54 mmHg  Pulse 96  Temp(Src) 98.5 F (36.9 C) (Oral)  Resp 16  Wt 119 lb (53.978 kg)  SpO2 100%   Physical Exam  Constitutional: He is oriented to person, place, and time. He appears well-developed and well-nourished.  HENT:  Head: Normocephalic and atraumatic.  Eyes: EOM are normal.  Neck: Normal range of motion.  Cardiovascular: Normal rate, regular rhythm, normal heart sounds and intact distal pulses.   Pulmonary/Chest: Effort normal and breath sounds normal. No respiratory distress.  Abdominal: Soft. He exhibits no distension. There is no tenderness. There is no rebound and no guarding.  Musculoskeletal: Normal range of motion.  Neurological:  He is alert and oriented to person, place, and time.  Skin: Skin is warm and dry.  Psychiatric: He has a normal mood and affect. Judgment normal.  Nursing note and vitals reviewed.   ED Course  Procedures (including critical care time)  DIAGNOSTIC STUDIES: Oxygen Saturation is 100% on RA, Normal by my interpretation.    COORDINATION OF CARE: 12:55 AM- Will give Zofran  and Percocet. Will order Lipase, CMP, CBC, and urinalysis. Discussed treatment plan with pt at bedside and pt agreed to plan.     Labs Review Labs Reviewed  CBC - Abnormal; Notable for the following:    MCV 73.3 (*)    MCH 23.9 (*)    All other components within normal limits  LIPASE, BLOOD  COMPREHENSIVE METABOLIC PANEL  URINALYSIS, ROUTINE W REFLEX MICROSCOPIC (NOT AT [facility])    Imaging Review No results found. I have personally reviewed and evaluated these images and lab results as part of my medical decision-making.   EKG Interpretation None      MDM   Final diagnoses:  None    No evidence of colitis on today's ct.  Labs reassuring.  Will treat with po flagyl in case this is an early recurrent colitis.  I personally performed the services described in this documentation, which was scribed in my presence. The recorded information has been reviewed and is accurate.      Kyle Speak, MD 02/03/15 [phone]

## 2015-02-03 NOTE — ED Notes (Signed)
The pt has returned from [place] being cold  Blanket given

## 2015-02-03 NOTE — ED Notes (Signed)
The pt is c/o mid-abd for several days with diarrhea.  The pt is feeling better after the opain med he was given at triage.  He has a headache and dizziness also

## 2015-02-03 NOTE — Discharge Instructions (Signed)
Flagyl as prescribed.  Return to the emergency department for worsening pain, bloody stool, or other new and concerning symptoms.   Abdominal Pain Many things can cause abdominal pain. Usually, abdominal pain is not caused by a disease and will improve without treatment. It can often be observed and treated at home. Your health care provider will do a physical exam and possibly order blood tests and X-rays to help determine the seriousness of your pain. However, in many cases, more time must pass before a clear cause of the pain can be found. Before that point, your health care provider may not know if you need more testing or further treatment. HOME CARE INSTRUCTIONS  Monitor your abdominal pain for any changes. The following actions may help to alleviate any discomfort you are experiencing:  Only take over-the-counter or prescription medicines as directed by your health care provider.  Do not take laxatives unless directed to do so by your health care provider.  Try a clear liquid diet (broth, tea, or water) as directed by your health care provider. Slowly move to a bland diet as tolerated. SEEK MEDICAL CARE IF:  You have unexplained abdominal pain.  You have abdominal pain associated with nausea or diarrhea.  You have pain when you urinate or have a bowel movement.  You experience abdominal pain that wakes you in the night.  You have abdominal pain that is worsened or improved by eating food.  You have abdominal pain that is worsened with eating fatty foods.  You have a fever. SEEK IMMEDIATE MEDICAL CARE IF:   Your pain does not go away within 2 hours.  You keep throwing up (vomiting).  Your pain is felt only in portions of the abdomen, such as the right side or the left lower portion of the abdomen.  You pass bloody or black tarry stools. MAKE SURE YOU:  Understand these instructions.   Will watch your condition.   Will get help right away if you are not doing well  or get worse.  Document Released: 02/19/2005 Document Revised: 05/17/2013 Document Reviewed: 01/19/2013 [facility] Patient Information 2015 [place], [place]. This information is not intended to replace advice given to you by your health care provider. Make sure you discuss any questions you have with your health care provider.

## 2015-02-03 NOTE — ED Notes (Signed)
The pt has drank his  Oral contrast  c-t notified

## 2016-06-05 ENCOUNTER — Emergency Department (HOSPITAL_COMMUNITY)
Admission: EM | Admit: 2016-06-05 | Discharge: 2016-06-06 | Disposition: A | Discharge status: Home / Self Care | Payer: 59 | Attending: Emergency Medicine | Admitting: Emergency Medicine

## 2016-06-05 ENCOUNTER — Emergency Department (HOSPITAL_COMMUNITY): Payer: 59

## 2016-06-05 ENCOUNTER — Encounter (HOSPITAL_COMMUNITY): Payer: Self-pay | Admitting: Emergency Medicine

## 2016-06-05 DIAGNOSIS — Z79899 Other long term (current) drug therapy: Secondary | ICD-10-CM | POA: Diagnosis not present

## 2016-06-05 DIAGNOSIS — R1084 Generalized abdominal pain: Secondary | ICD-10-CM

## 2016-06-05 DIAGNOSIS — R110 Nausea: Secondary | ICD-10-CM | POA: Insufficient documentation

## 2016-06-05 HISTORY — DX: Noninfective gastroenteritis and colitis, unspecified: K52.9

## 2016-06-05 LAB — LIPASE, BLOOD: Lipase: 39 U/L (ref 11–51)

## 2016-06-05 LAB — COMPREHENSIVE METABOLIC PANEL
ALBUMIN: 4.6 g/dL (ref 3.5–5.0)
ALK PHOS: 67 U/L (ref 38–126)
ALT: 21 U/L (ref 17–63)
AST: 31 U/L (ref 15–41)
Anion gap: 11 (ref 5–15)
BUN: 16 mg/dL (ref 6–20)
CO2: 20 mmol/L — ABNORMAL LOW (ref 22–32)
Calcium: 9.6 mg/dL (ref 8.9–10.3)
Chloride: 105 mmol/L (ref 101–111)
Creatinine, Ser: 0.99 mg/dL (ref 0.61–1.24)
GFR calc Af Amer: >60 mL/min (ref >60)
GFR calc non Af Amer: >60 mL/min (ref >60)
Glucose, Bld: 105 mg/dL — ABNORMAL HIGH (ref 65–99)
Potassium: 3.9 mmol/L (ref 3.5–5.1)
SODIUM: 136 mmol/L (ref 135–145)
TOTAL PROTEIN: 7.8 g/dL (ref 6.5–8.1)
Total Bilirubin: 4.9 mg/dL — ABNORMAL HIGH (ref 0.3–1.2)

## 2016-06-05 LAB — CBC
HCT: 39.5 % (ref 39.0–52.0)
HEMOGLOBIN: 12.3 g/dL — AB (ref 13.0–17.0)
MCH: 22.4 pg — AB (ref 26.0–34.0)
MCHC: 31.1 g/dL (ref 30.0–36.0)
MCV: 72.1 fL — AB (ref 78.0–100.0)
Platelets: 151 10*3/uL (ref 150–400)
RBC: 5.48 MIL/uL (ref 4.22–5.81)
RDW: 15.6 % — ABNORMAL HIGH (ref 11.5–15.5)
WBC: 7.1 10*3/uL (ref 4.0–10.5)

## 2016-06-05 MED ORDER — SODIUM CHLORIDE 0.9 % IV BOLUS (SEPSIS)
1000.0000 mL | Freq: Once | INTRAVENOUS | Status: AC
  Administered 2016-06-05: 1000 mL via INTRAVENOUS

## 2016-06-05 MED ORDER — ONDANSETRON HCL 4 MG/2ML IJ SOLN
4.0000 mg | Freq: Once | INTRAMUSCULAR | Status: AC
  Administered 2016-06-05: 4 mg via INTRAVENOUS
  Filled 2016-06-05: qty 2

## 2016-06-05 MED ORDER — MORPHINE SULFATE (PF) 4 MG/ML IV SOLN
4.0000 mg | Freq: Once | INTRAVENOUS | Status: AC
  Administered 2016-06-05: 4 mg via INTRAVENOUS
  Filled 2016-06-05: qty 1

## 2016-06-05 MED ORDER — IOPAMIDOL (ISOVUE-300) INJECTION 61%
INTRAVENOUS | Status: AC
  Administered 2016-06-05: 100 mL
  Filled 2016-06-05: qty 100

## 2016-06-05 NOTE — ED Provider Notes (Signed)
[facility] DEPT Provider Note   CSN: [id number] Arrival date & time: 06/05/16  1535     History   Chief Complaint Chief Complaint  Patient presents with  . Abdominal Pain  . Back Pain  . Hyperventilating    HPI Kyle Cooke is a 56 y.o. male.  Kyle Cooke is a 56 y.o. Male who presents to the ED complaining of generalized abdominal pain beginning this morning. Patient reports his abdomen is generally painful all over and he feels like it is cramping and tight. He tells me he has some nausea but has had no vomiting or diarrhea. He's had colitis previously reports this feels similar. No previous surgeries to his abdomen. No recent antibiotics. No recent medications. Apparently, on arrival patient had some hyperventilation and reports tingling in his hands and feet. This has resolved at this time. History is somewhat difficult to obtain due to language barrier. No language interpreter available on [place] [employer]. History is obtained through the patient who speaks some English and with his uncle on the phone who speaks [place]. Patient denies fevers, urinary symptoms, pain to his testicles or penis, chest pain, shortness of breath, vomiting, diarrhea, rashes.   The history is provided by the patient and a relative. The history is limited by a language barrier. A language interpreter was used.  Abdominal Pain   Associated symptoms include nausea. Pertinent negatives include fever, diarrhea, vomiting, dysuria and headaches.  Back Pain   Associated symptoms include abdominal pain. Pertinent negatives include no chest pain, no fever, no headaches and no dysuria.    Past Medical History:  Diagnosis Date  . Anemia   . Atypical chest pain     Noncardiac in etiology with negative cardiac enzymes 2-D echo and EKG during December 2011 admission  . Colitis   . 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 Hypokalemia   . Vitamin D deficiency     Patient Active Problem List   Diagnosis  Date Noted  . Intercostal pain   . Colitis 08/04/2014  . Abdominal pain 06/02/2014  . Microcytic anemia 05/10/2010    History reviewed. No pertinent surgical history.     Home Medications    Prior to Admission medications   Medication Sig Start Date End Date Taking? Authorizing Provider  dicyclomine (BENTYL) 20 MG tablet Take 1 tablet (20 mg total) by mouth 2 (two) times daily. 06/06/16   Waynetta Pean, PA-C  metroNIDAZOLE (FLAGYL) 500 MG tablet Take 1 tablet (500 mg total) by mouth 3 (three) times daily. 02/03/15   Veryl Speak, MD  omeprazole (PRILOSEC) 20 MG capsule Take 1 capsule (20 mg total) by mouth 2 (two) times daily before a meal. Patient not taking: Reported on 02/03/2015 11/21/14   Vivi Barrack, MD  ondansetron (ZOFRAN ODT) 4 MG disintegrating tablet Take 1 tablet (4 mg total) by mouth every 8 (eight) hours as needed for nausea or vomiting. 06/06/16   Waynetta Pean, PA-C    Family History No family history on file.  Social History Social History  Substance Use Topics  . Smoking status: Never Smoker  . Smokeless tobacco: Not on file  . Alcohol use No     Allergies   Patient has no known allergies.   Review of Systems Review of Systems  Constitutional: Negative for chills and fever.  HENT: Negative for sore throat.   Eyes: Negative for visual disturbance.  Respiratory: Negative for cough, shortness of breath and wheezing.   Cardiovascular: Negative for chest  pain and palpitations.  Gastrointestinal: Positive for abdominal pain and nausea. Negative for blood in stool, diarrhea and vomiting.  Genitourinary: Negative for difficulty urinating, dysuria, penile pain and testicular pain.  Musculoskeletal: Negative for neck pain.  Skin: Negative for rash.  Neurological: Negative for headaches.     Physical Exam Updated Vital Signs BP 114/77   Pulse 71   Temp 98.8 F (37.1 C)   Resp 16   Ht 5\' 2"  (1.575 m)   Wt 54.4 kg   SpO2 97%   BMI 21.95 kg/m    Physical Exam  Constitutional: He appears well-developed and well-nourished. No distress.  Nontoxic appearing.  HENT:  Head: Normocephalic and atraumatic.  Mouth/Throat: Oropharynx is clear and moist.  Eyes: Conjunctivae are normal. Pupils are equal, round, and reactive to light. Right eye exhibits no discharge. Left eye exhibits no discharge. No scleral icterus.  Neck: Neck supple.  Cardiovascular: Normal rate, regular rhythm, normal heart sounds and intact distal pulses.  Exam reveals no gallop and no friction rub.   No murmur heard. Pulmonary/Chest: Effort normal and breath sounds normal. No respiratory distress. He has no wheezes. He has no rales.  Lungs clear to auscultation bilaterally.  Abdominal: Soft. Bowel sounds are normal. He exhibits no distension and no mass. There is tenderness. There is no rebound and no guarding.  Abdomen is mildly generally tender to palpation. No focal tenderness to palpation. No CVA or flank tenderness. No peritoneal signs.  Musculoskeletal: He exhibits no edema or tenderness.  Lymphadenopathy:    He has no cervical adenopathy.  Neurological: He is alert. Coordination normal.  Skin: Skin is warm and dry. Capillary refill takes less than 2 seconds. No rash noted. He is not diaphoretic. No erythema. No pallor.  Psychiatric: He has a normal mood and affect. His behavior is normal.  Nursing note and vitals reviewed.    ED Treatments / Results  Labs (all labs ordered are listed, but only abnormal results are displayed) Labs Reviewed  COMPREHENSIVE METABOLIC PANEL - Abnormal; Notable for the following:       Result Value   CO2 20 (*)    Glucose, Bld 105 (*)    Total Bilirubin 4.9 (*)    All other components within normal limits  CBC - Abnormal; Notable for the following:    Hemoglobin 12.3 (*)    MCV 72.1 (*)    MCH 22.4 (*)    RDW 15.6 (*)    All other components within normal limits  URINALYSIS, ROUTINE W REFLEX MICROSCOPIC - Abnormal;  Notable for the following:    Specific Gravity, Urine 1.039 (*)    Ketones, ur 5 (*)    All other components within normal limits  LIPASE, BLOOD    EKG  EKG Interpretation None       Radiology Ct Abdomen Pelvis W Contrast  Result Date: 06/05/2016 CLINICAL DATA:  Abdominal cramping and nausea EXAM: CT ABDOMEN AND PELVIS WITH CONTRAST TECHNIQUE: Multidetector CT imaging of the abdomen and pelvis was performed using the standard protocol following bolus administration of intravenous contrast. CONTRAST:  100 mL ISOVUE-300 IOPAMIDOL (ISOVUE-300) INJECTION 61% COMPARISON:  CT abdomen pelvis 02/03/2015 FINDINGS: Despite efforts by the technologist and patient, motion artifact is present on today's examination and could not be eliminated. This reduces the sensitivity and specificity of the study. Lower chest: No pulmonary nodules. No visible pleural or pericardial effusion. Hepatobiliary: Normal hepatic size and contours without focal liver lesion. No perihepatic ascites. No intra- or extrahepatic  biliary dilatation. Normal gallbladder. Pancreas: Normal pancreatic contours and enhancement. No peripancreatic fluid collection or pancreatic ductal dilatation. Spleen: Normal. Adrenals/Urinary Tract: Normal adrenal glands. No hydronephrosis or solid renal mass. Stomach/Bowel: No abnormal bowel dilatation. No bowel wall thickening or adjacent fat stranding to indicate acute inflammation. No abdominal fluid collection. Normal appendix. Vascular/Lymphatic: Normal course and caliber of the major abdominal vessels. No abdominal or pelvic adenopathy. Reproductive: Normal prostate and seminal vesicles. Musculoskeletal: No lytic or blastic osseous lesion. Normal visualized extrathoracic and extraperitoneal soft tissues. Other: No contributory non-categorized findings. IMPRESSION: 1. Motion degraded examination limits assessment of the small bowel and colon. 2. Within the above limitation, no acute abnormality of the  abdomen or pelvis. Electronically Signed   By: Ulyses Jarred M.D.   On: 06/05/2016 23:48    Procedures Procedures (including critical care time)  Medications Ordered in ED Medications  sodium chloride 0.9 % bolus 1,000 mL (0 mLs Intravenous Stopped 06/06/16 0018)  ondansetron (ZOFRAN) injection 4 mg (4 mg Intravenous Given 06/05/16 2230)  morphine 4 MG/ML injection 4 mg (4 mg Intravenous Given 06/05/16 2230)  iopamidol (ISOVUE-300) 61 % injection (100 mLs  Contrast Given 06/05/16 2322)  dicyclomine (BENTYL) capsule 10 mg (10 mg Oral Given 06/06/16 0058)     Initial Impression / Assessment and Plan / ED Course  I have reviewed the triage vital signs and the nursing notes.  Pertinent labs & imaging results that were available during my care of the patient were reviewed by me and considered in my medical decision making (see chart for details).  Clinical Course    This is a 56 y.o. Male who presents to the ED complaining of generalized abdominal pain beginning this morning. Patient reports his abdomen is generally painful all over and he feels like it is cramping and tight. He tells me he has some nausea but has had no vomiting or diarrhea. He's had colitis previously reports this feels similar. No previous surgeries to his abdomen. No recent antibiotics. No recent medications. Apparently, on arrival patient had some hyperventilation and reports tingling in his hands and feet. This has resolved at this time. History is somewhat difficult to obtain due to language barrier. No language interpreter available on [place] [employer]. History is obtained through the patient who speaks some English and with his uncle on the phone who speaks [place]. On exam the patient is afebrile nontoxic appearing. His abdomen is soft and he has mild generalized abdominal tenderness to palpation. No focal tenderness. No peritoneal signs. Lipase is within normal limits. CMP is remarkable for a total bili of 4.9. Last  year this was 2.7. No RUQ tenderness or murphys sign. Normal gallbladder on CT scan. Normal liver enzymes.  CBC is unremarkable. Urinalysis is without sign of infection. CT shows no acute abnormality of the abdomen or pelvis. Normal gallbladder. Reevaluation patient reports he is feeling much better. His abdominal pain has resolved. Repeat abdominal exam is benign. No abdominal tenderness to palpation. Patient has tolerated by mouth without nausea or vomiting. Just the findings. Will have him follow-up closely with primary care. I advised the patient to follow-up with their primary care provider this week. I advised the patient to return to the emergency department with new or worsening symptoms or new concerns. The patient verbalized understanding and agreement with plan.      Final Clinical Impressions(s) / ED Diagnoses   Final diagnoses:  Generalized abdominal pain  Nausea    New Prescriptions New Prescriptions   DICYCLOMINE (  BENTYL) 20 MG TABLET    Take 1 tablet (20 mg total) by mouth 2 (two) times daily.   ONDANSETRON (ZOFRAN ODT) 4 MG DISINTEGRATING TABLET    Take 1 tablet (4 mg total) by mouth every 8 (eight) hours as needed for nausea or vomiting.         Waynetta Pean, PA-C 06/06/16 0111    Merrily Pew, MD 06/09/16 [phone]

## 2016-06-05 NOTE — ED Triage Notes (Addendum)
Pt hyperventilating on arrival to ED, c/o severe abd cramping, pt is having cramping in hands/feet at present -- son with pt to interpret-- pt denies any nausea/vomiting/diarrhea--  Pt was admitted for colitis 2 years ago-- son states his symptoms were same.

## 2016-06-05 NOTE — ED Notes (Signed)
Patient transported to CT 

## 2016-06-05 NOTE — ED Notes (Signed)
EDP at bedside  

## 2016-06-06 LAB — URINALYSIS, ROUTINE W REFLEX MICROSCOPIC
Bilirubin Urine: NEGATIVE
Glucose, UA: NEGATIVE mg/dL
Hgb urine dipstick: NEGATIVE
KETONES UR: 5 mg/dL — AB
LEUKOCYTES UA: NEGATIVE
NITRITE: NEGATIVE
PH: 6 (ref 5.0–8.0)
PROTEIN: NEGATIVE mg/dL
Specific Gravity, Urine: 1.039 — ABNORMAL HIGH (ref 1.005–1.030)

## 2016-06-06 MED ORDER — ONDANSETRON 4 MG PO TBDP: 4.0000 mg | ORAL_TABLET | Freq: Three times a day (TID) | ORAL | 0 refills | Status: DC | PRN

## 2016-06-06 MED ORDER — DICYCLOMINE HCL 10 MG PO CAPS
10.0000 mg | ORAL_CAPSULE | Freq: Once | ORAL | Status: AC
  Administered 2016-06-06: 10 mg via ORAL
  Filled 2016-06-06: qty 1

## 2016-06-06 MED ORDER — DICYCLOMINE HCL 20 MG PO TABS: 20.0000 mg | ORAL_TABLET | Freq: Two times a day (BID) | ORAL | 0 refills | Status: DC

## 2017-07-21 ENCOUNTER — Encounter (HOSPITAL_COMMUNITY): Payer: Self-pay | Admitting: Emergency Medicine

## 2017-07-21 ENCOUNTER — Ambulatory Visit (HOSPITAL_COMMUNITY)
Admission: EM | Admit: 2017-07-21 | Discharge: 2017-07-21 | Disposition: A | Discharge status: Nursing Home (Includes ICF) | Payer: 59 | Source: Home / Self Care

## 2017-07-21 ENCOUNTER — Emergency Department (HOSPITAL_COMMUNITY): Payer: 59

## 2017-07-21 ENCOUNTER — Emergency Department (HOSPITAL_COMMUNITY)
Admission: EM | Admit: 2017-07-21 | Discharge: 2017-07-22 | Disposition: A | Discharge status: Home / Self Care | Payer: 59 | Attending: Emergency Medicine | Admitting: Emergency Medicine

## 2017-07-21 DIAGNOSIS — R109 Unspecified abdominal pain: Secondary | ICD-10-CM | POA: Diagnosis present

## 2017-07-21 DIAGNOSIS — Z79899 Other long term (current) drug therapy: Secondary | ICD-10-CM | POA: Diagnosis not present

## 2017-07-21 DIAGNOSIS — R1084 Generalized abdominal pain: Secondary | ICD-10-CM | POA: Insufficient documentation

## 2017-07-21 LAB — URINALYSIS, ROUTINE W REFLEX MICROSCOPIC
Bilirubin Urine: NEGATIVE
Glucose, UA: NEGATIVE mg/dL
HGB URINE DIPSTICK: NEGATIVE
Ketones, ur: 5 mg/dL — AB
LEUKOCYTES UA: NEGATIVE
Nitrite: NEGATIVE
PROTEIN: NEGATIVE mg/dL
Specific Gravity, Urine: 1.019 (ref 1.005–1.030)
pH: 5 (ref 5.0–8.0)

## 2017-07-21 LAB — COMPREHENSIVE METABOLIC PANEL
ALT: 19 U/L (ref 17–63)
ANION GAP: 12 (ref 5–15)
AST: 23 U/L (ref 15–41)
Albumin: 4.5 g/dL (ref 3.5–5.0)
Alkaline Phosphatase: 73 U/L (ref 38–126)
BUN: 19 mg/dL (ref 6–20)
CALCIUM: 9.6 mg/dL (ref 8.9–10.3)
CHLORIDE: 102 mmol/L (ref 101–111)
CO2: 21 mmol/L — AB (ref 22–32)
CREATININE: 0.83 mg/dL (ref 0.61–1.24)
Glucose, Bld: 79 mg/dL (ref 65–99)
Potassium: 3.4 mmol/L — ABNORMAL LOW (ref 3.5–5.1)
SODIUM: 135 mmol/L (ref 135–145)
Total Bilirubin: 2.5 mg/dL — ABNORMAL HIGH (ref 0.3–1.2)
Total Protein: 7.3 g/dL (ref 6.5–8.1)

## 2017-07-21 LAB — CBC
HCT: 38.5 % — ABNORMAL LOW (ref 39.0–52.0)
HEMOGLOBIN: 11.9 g/dL — AB (ref 13.0–17.0)
MCH: 23 pg — AB (ref 26.0–34.0)
MCHC: 30.9 g/dL (ref 30.0–36.0)
MCV: 74.5 fL — AB (ref 78.0–100.0)
PLATELETS: 157 10*3/uL (ref 150–400)
RBC: 5.17 MIL/uL (ref 4.22–5.81)
RDW: 16.7 % — ABNORMAL HIGH (ref 11.5–15.5)
WBC: 8 10*3/uL (ref 4.0–10.5)

## 2017-07-21 LAB — LIPASE, BLOOD: LIPASE: 43 U/L (ref 11–51)

## 2017-07-21 MED ORDER — OXYCODONE-ACETAMINOPHEN 5-325 MG PO TABS
1.0000 | ORAL_TABLET | ORAL | Status: DC | PRN
  Administered 2017-07-21: 1 via ORAL
  Filled 2017-07-21: qty 1

## 2017-07-21 MED ORDER — SODIUM CHLORIDE 0.9 % IV SOLN
INTRAVENOUS | Status: DC
  Administered 2017-07-21: 22:00:00 via INTRAVENOUS

## 2017-07-21 MED ORDER — KETOROLAC TROMETHAMINE 30 MG/ML IJ SOLN
30.0000 mg | Freq: Once | INTRAMUSCULAR | Status: AC
  Administered 2017-07-22: 30 mg via INTRAVENOUS
  Filled 2017-07-21: qty 1

## 2017-07-21 MED ORDER — ONDANSETRON HCL 4 MG/2ML IJ SOLN
4.0000 mg | Freq: Once | INTRAMUSCULAR | Status: AC
  Administered 2017-07-21: 4 mg via INTRAVENOUS
  Filled 2017-07-21: qty 2

## 2017-07-21 MED ORDER — HYDROCODONE-ACETAMINOPHEN 5-325 MG PO TABS: 1.0000 | ORAL_TABLET | Freq: Four times a day (QID) | ORAL | 0 refills | Status: DC | PRN

## 2017-07-21 MED ORDER — ONDANSETRON 4 MG PO TBDP
4.0000 mg | ORAL_TABLET | Freq: Once | ORAL | Status: AC | PRN
  Administered 2017-07-21: 4 mg via ORAL
  Filled 2017-07-21: qty 1

## 2017-07-21 MED ORDER — IOPAMIDOL (ISOVUE-300) INJECTION 61%
INTRAVENOUS | Status: AC
  Administered 2017-07-21: 100 mL
  Filled 2017-07-21: qty 100

## 2017-07-21 MED ORDER — HYDROMORPHONE HCL 1 MG/ML IJ SOLN
1.0000 mg | Freq: Once | INTRAMUSCULAR | Status: AC
  Administered 2017-07-21: 1 mg via INTRAVENOUS
  Filled 2017-07-21: qty 1

## 2017-07-21 NOTE — ED Provider Notes (Signed)
[facility] EMERGENCY DEPARTMENT Provider Note   CSN: [id number] Arrival date & time: 07/21/17  1853     History   Chief Complaint Chief Complaint  Patient presents with  . Abdominal Pain    HPI Kyle Cooke is a 57 y.o. male.  Patient with a history of recurrent abdominal pain.  Most recently seen January 2018 a year ago with CT scan that was negative.  Frequently has elevated bilirubin.  Patient is gone quite a long time without any recurrence of abdominal pain but had an acute attack starting this morning and has persisted.  Labs without significant abnormalities other than bilirubin is still slightly elevated but actually improved from the past.  Still having persistent pain despite a dose of hydrocodone in the waiting room area triage area.  Patient states the pain is generalized very sharp in nature sometimes 10 out of 10.  Family member is with him and is an excellent interpreter.       Past Medical History:  Diagnosis Date  . Anemia   . Atypical chest pain     Noncardiac in etiology with negative cardiac enzymes 2-D echo and EKG during December 2011 admission  . Colitis   . 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 Hypokalemia   . Vitamin D deficiency     Patient Active Problem List   Diagnosis Date Noted  . Intercostal pain   . Colitis 08/04/2014  . Abdominal pain 06/02/2014  . Microcytic anemia 05/10/2010    History reviewed. No pertinent surgical history.     Home Medications    Prior to Admission medications   Medication Sig Start Date End Date Taking? Authorizing Provider  dicyclomine (BENTYL) 20 MG tablet Take 1 tablet (20 mg total) by mouth 2 (two) times daily. Patient not taking: Reported on 07/21/2017 06/06/16   Waynetta Pean, PA-C  HYDROcodone-acetaminophen (NORCO/VICODIN) 5-325 MG tablet Take 1-2 tablets by mouth every 6 (six) hours as needed for moderate pain. 07/21/17   Fredia Sorrow, MD  metroNIDAZOLE (FLAGYL) 500 MG  tablet Take 1 tablet (500 mg total) by mouth 3 (three) times daily. Patient not taking: Reported on 07/21/2017 02/03/15   Veryl Speak, MD  omeprazole (PRILOSEC) 20 MG capsule Take 1 capsule (20 mg total) by mouth 2 (two) times daily before a meal. Patient not taking: Reported on 02/03/2015 11/21/14   Vivi Barrack, MD  ondansetron (ZOFRAN ODT) 4 MG disintegrating tablet Take 1 tablet (4 mg total) by mouth every 8 (eight) hours as needed for nausea or vomiting. Patient not taking: Reported on 07/21/2017 06/06/16   Waynetta Pean, PA-C    Family History No family history on file.  Social History Social History   Tobacco Use  . Smoking status: Never Smoker  Substance Use Topics  . Alcohol use: No    Alcohol/week: 0.0 oz  . Drug use: No     Allergies   Patient has no known allergies.   Review of Systems Review of Systems  Constitutional: Negative for fever.  HENT: Negative for congestion.   Eyes: Negative for redness.  Respiratory: Negative for shortness of breath.   Cardiovascular: Negative for chest pain.  Gastrointestinal: Positive for abdominal pain. Negative for diarrhea, nausea and vomiting.  Genitourinary: Negative for dysuria.  Musculoskeletal: Negative for back pain.  Skin: Negative for rash.  Neurological: Negative for headaches.  Hematological: Does not bruise/bleed easily.  Psychiatric/Behavioral: Negative for confusion.     Physical Exam Updated Vital Signs BP  121/71   Pulse 62   Temp 97.9 F (36.6 C) (Oral)   Resp 15   Ht 1.524 m (5')   Wt 54.4 kg (120 lb)   SpO2 94%   BMI 23.44 kg/m   Physical Exam  Constitutional: He appears well-developed and well-nourished. He appears distressed.  HENT:  Head: Normocephalic and atraumatic.  Mouth/Throat: Oropharynx is clear and moist.  Eyes: EOM are normal. Pupils are equal, round, and reactive to light.  Neck: Neck supple.  Cardiovascular: Normal rate, regular rhythm and normal heart sounds.    Pulmonary/Chest: Effort normal and breath sounds normal.  Abdominal: Soft. Bowel sounds are normal. He exhibits no distension. There is no tenderness.  Musculoskeletal: Normal range of motion. He exhibits no edema.  Neurological: He is alert. No cranial nerve deficit or sensory deficit. He exhibits normal muscle tone. Coordination normal.  Skin: Skin is warm.  Nursing note and vitals reviewed.    ED Treatments / Results  Labs (all labs ordered are listed, but only abnormal results are displayed) Labs Reviewed  COMPREHENSIVE METABOLIC PANEL - Abnormal; Notable for the following components:      Result Value   Potassium 3.4 (*)    CO2 21 (*)    Total Bilirubin 2.5 (*)    All other components within normal limits  CBC - Abnormal; Notable for the following components:   Hemoglobin 11.9 (*)    HCT 38.5 (*)    MCV 74.5 (*)    MCH 23.0 (*)    RDW 16.7 (*)    All other components within normal limits  URINALYSIS, ROUTINE W REFLEX MICROSCOPIC - Abnormal; Notable for the following components:   Ketones, ur 5 (*)    All other components within normal limits  LIPASE, BLOOD    EKG  EKG Interpretation None       Radiology No results found.  Procedures Procedures (including critical care time)  Medications Ordered in ED Medications  oxyCODONE-acetaminophen (PERCOCET/ROXICET) 5-325 MG per tablet 1 tablet (1 tablet Oral Given 07/21/17 1933)  0.9 %  sodium chloride infusion ( Intravenous New Bag/Given 07/21/17 2146)  iopamidol (ISOVUE-300) 61 % injection (not administered)  ondansetron (ZOFRAN-ODT) disintegrating tablet 4 mg (4 mg Oral Given 07/21/17 1935)  ondansetron (ZOFRAN) injection 4 mg (4 mg Intravenous Given 07/21/17 2146)  HYDROmorphone (DILAUDID) injection 1 mg (1 mg Intravenous Given 07/21/17 2146)     Initial Impression / Assessment and Plan / ED Course  I have reviewed the triage vital signs and the nursing notes.  Pertinent labs & imaging results that were  available during my care of the patient were reviewed by me and considered in my medical decision making (see chart for details).     History of recurrent abdominal pain.  But has not had an episode for about a year.  Since his been a while last CT scan was January 2018 and he is in his 72s when had CT to him because pain was persistent despite pain medication given in triage.  Labs without significant abnormalities other than a bilirubin 0.5 which is actually improved from a year ago.  In the past his workups have not shown a cause of the abdominal pain.  CT is being done to rule out any acute abdominal process.  If negative patient can be discharged home with a short course of pain medicine.  Also recommending follow-up with the wellness clinic.    Final Clinical Impressions(s) / ED Diagnoses   Final diagnoses:  Generalized  abdominal pain    ED Discharge Orders        Ordered    HYDROcodone-acetaminophen (NORCO/VICODIN) 5-325 MG tablet  Every 6 hours PRN     07/21/17 2240       Fredia Sorrow, MD 07/21/17 2246

## 2017-07-21 NOTE — ED Triage Notes (Signed)
Pt reports gen abd pain onset this AM, cramping, 10/10. Pt appears to be in discomfort in triage. Denies N/V/D.

## 2017-07-21 NOTE — ED Notes (Signed)
Patient transported to CT 

## 2017-07-21 NOTE — ED Provider Notes (Signed)
Patient placed in Quick Look pathway, seen and evaluated   Chief Complaint: Abdominal pain  HPI:   Patient's daughter served as Optometrist.  Patient presents with generalized abdominal pain which began this morning.  Pain is cramping and intermittent but 10/10 in severity when it comes on.  No aggravating or alleviating factors noted.  No suspicious food intake.  Nausea but no vomiting.  No diarrhea or constipation.  No melena, hematochezia dysuria, hematuria, urgency, or frequency.  No fevers or chills.  No recent treatment with antibiotics.  ROS: Positive for abdominal pain, nausea.  Negative for fevers, chills, urinary symptoms, diarrhea, constipation, melena, or hematochezia.  Physical Exam:   Gen: No distress  Neuro: Awake and Alert  Skin: Warm    Focused Exam: Patient appears somewhat uncomfortable.  Bowel sounds hypoactive but present.  Diffuse tenderness to palpation with no focal tenderness.  Murphy sign absent, Rovsing's absent, no CVA tenderness.   Initiation of care has begun. The patient has been counseled on the process, plan, and necessity for staying for the completion/evaluation, and the remainder of the medical screening examination    Debroah Baller 07/21/17 Lubertha South, MD 07/21/17 [phone]

## 2017-07-21 NOTE — Discharge Instructions (Signed)
Return for any new or worse symptoms.  Workup without significant findings.  Follow-up with wellness clinic.

## 2017-07-21 NOTE — ED Notes (Signed)
Pt placed on 2L [place]. Pt's sats would drop to the 70's intermittently. MD made aware

## 2017-07-22 NOTE — ED Provider Notes (Signed)
12:50 AM Patient care assumed from Dr. Rogene Houston at change of shift.  Patient presenting for abdominal pain.  He has a history of chronic abdominal pain for which he has presented to the ED frequently in the past.  Last visit for similar complaints was 1 year ago.  Laboratory workup reassuring at change of shift.  Pending CT scan.  Imaging has been reviewed and does not show any concerning process in the abdomen or pelvis.  Of note, the appendix and gallbladder were difficult to visualize secondary to motion degradation; however, there is no evidence of inflammatory changes to either of these areas.  Pain has improved with Dilaudid, though patient does note worsening headache since receiving this medicine.  He has been ordered to receive Toradol for additional pain control and headache management.  I do not believe further emergent workup is indicated at this time.  Return precautions discussed and provided. Patient discharged in stable condition with no unaddressed concerns.   Vitals:   07/21/17 2130 07/21/17 2230 07/21/17 2300 07/22/17 0015  BP: 135/76 121/71 120/65 118/88  Pulse: 77 62 63 60  Resp:  15  16  Temp:      TempSrc:      SpO2: 96% 94% 93% 96%  Weight:      Height:        Results for orders placed or performed during the hospital encounter of 07/21/17  Lipase, blood  Result Value Ref Range   Lipase 43 11 - 51 U/L  Comprehensive metabolic panel  Result Value Ref Range   Sodium 135 135 - 145 mmol/L   Potassium 3.4 (L) 3.5 - 5.1 mmol/L   Chloride 102 101 - 111 mmol/L   CO2 21 (L) 22 - 32 mmol/L   Glucose, Bld 79 65 - 99 mg/dL   BUN 19 6 - 20 mg/dL   Creatinine, Ser 0.83 0.61 - 1.24 mg/dL   Calcium 9.6 8.9 - 10.3 mg/dL   Total Protein 7.3 6.5 - 8.1 g/dL   Albumin 4.5 3.5 - 5.0 g/dL   AST 23 15 - 41 U/L   ALT 19 17 - 63 U/L   Alkaline Phosphatase 73 38 - 126 U/L   Total Bilirubin 2.5 (H) 0.3 - 1.2 mg/dL   GFR calc non Af Amer >60 >60 mL/min   GFR calc Af Amer >60 >60  mL/min   Anion gap 12 5 - 15  CBC  Result Value Ref Range   WBC 8.0 4.0 - 10.5 K/uL   RBC 5.17 4.22 - 5.81 MIL/uL   Hemoglobin 11.9 (L) 13.0 - 17.0 g/dL   HCT 38.5 (L) 39.0 - 52.0 %   MCV 74.5 (L) 78.0 - 100.0 fL   MCH 23.0 (L) 26.0 - 34.0 pg   MCHC 30.9 30.0 - 36.0 g/dL   RDW 16.7 (H) 11.5 - 15.5 %   Platelets 157 150 - 400 K/uL  Urinalysis, Routine w reflex microscopic  Result Value Ref Range   Color, Urine YELLOW YELLOW   APPearance CLEAR CLEAR   Specific Gravity, Urine 1.019 1.005 - 1.030   pH 5.0 5.0 - 8.0   Glucose, UA NEGATIVE NEGATIVE mg/dL   Hgb urine dipstick NEGATIVE NEGATIVE   Bilirubin Urine NEGATIVE NEGATIVE   Ketones, ur 5 (A) NEGATIVE mg/dL   Protein, ur NEGATIVE NEGATIVE mg/dL   Nitrite NEGATIVE NEGATIVE   Leukocytes, UA NEGATIVE NEGATIVE   Ct Abdomen Pelvis W Contrast  Result Date: 07/21/2017 CLINICAL DATA:  Generalized abdominal pain EXAM:  CT ABDOMEN AND PELVIS WITH CONTRAST TECHNIQUE: Multidetector CT imaging of the abdomen and pelvis was performed using the standard protocol following bolus administration of intravenous contrast. CONTRAST:  100 mL Isovue-300 intravenous COMPARISON:  CT 06/05/2016, 02/03/2015 FINDINGS: Motion degraded study. Lower chest: No acute abnormality. Hepatobiliary: No focal hepatic abnormality or biliary dilatation. Gallbladder not well seen due to motion Pancreas: Unremarkable. No pancreatic ductal dilatation or surrounding inflammatory changes. Spleen: Normal in size without focal abnormality. Adrenals/Urinary Tract: Adrenal glands are within normal limits. Probable cyst in the mid to lower left kidney. Bladder unremarkable Stomach/Bowel: Stomach is within normal limits. Appendix not well seen, also due to motion. No evidence of bowel wall thickening, distention, or inflammatory changes. Vascular/Lymphatic: No significant vascular findings are present. No enlarged abdominal or pelvic lymph nodes. Reproductive: Prostate is slightly enlarged.  Other: Negative for free air or free fluid Musculoskeletal: No acute or significant osseous findings. IMPRESSION: 1. Motion degraded study. Gallbladder and appendix poorly visible due to patient motion 2. No definite acute abnormalities are seen. Electronically Signed   By: Donavan Foil M.D.   On: 07/21/2017 23:42      Antonietta Breach, PA-C 07/22/17 0052    Ezequiel Essex, MD 07/22/17 [phone]

## 2017-07-22 NOTE — ED Notes (Signed)
Patient Alert and oriented to baseline. Stable and ambulatory to baseline. Patient and family verbalized understanding of the discharge instructions.  Patient belongings were taken by the patient.

## 2017-08-26 ENCOUNTER — Emergency Department (HOSPITAL_COMMUNITY): Payer: 59

## 2017-08-26 ENCOUNTER — Ambulatory Visit (INDEPENDENT_AMBULATORY_CARE_PROVIDER_SITE_OTHER): Payer: 59 | Admitting: Emergency Medicine

## 2017-08-26 ENCOUNTER — Other Ambulatory Visit: Payer: Self-pay

## 2017-08-26 ENCOUNTER — Encounter (HOSPITAL_COMMUNITY): Payer: Self-pay

## 2017-08-26 ENCOUNTER — Encounter: Payer: Self-pay | Admitting: Emergency Medicine

## 2017-08-26 VITALS — BP 136/88 | HR 82 | Resp 16 | Ht 60.0 in | Wt 120.0 lb

## 2017-08-26 DIAGNOSIS — R0602 Shortness of breath: Secondary | ICD-10-CM | POA: Insufficient documentation

## 2017-08-26 DIAGNOSIS — R079 Chest pain, unspecified: Secondary | ICD-10-CM | POA: Diagnosis not present

## 2017-08-26 LAB — BASIC METABOLIC PANEL
ANION GAP: 10 (ref 5–15)
BUN: 10 mg/dL (ref 6–20)
CALCIUM: 9 mg/dL (ref 8.9–10.3)
CO2: 23 mmol/L (ref 22–32)
Chloride: 106 mmol/L (ref 101–111)
Creatinine, Ser: 0.74 mg/dL (ref 0.61–1.24)
Glucose, Bld: 82 mg/dL (ref 65–99)
Potassium: 3.5 mmol/L (ref 3.5–5.1)
Sodium: 139 mmol/L (ref 135–145)

## 2017-08-26 LAB — CBC
HCT: 37.6 % — ABNORMAL LOW (ref 39.0–52.0)
HEMOGLOBIN: 11.4 g/dL — AB (ref 13.0–17.0)
MCH: 22.5 pg — AB (ref 26.0–34.0)
MCHC: 30.3 g/dL (ref 30.0–36.0)
MCV: 74.3 fL — ABNORMAL LOW (ref 78.0–100.0)
Platelets: 179 10*3/uL (ref 150–400)
RBC: 5.06 MIL/uL (ref 4.22–5.81)
RDW: 16.1 % — ABNORMAL HIGH (ref 11.5–15.5)
WBC: 5.7 10*3/uL (ref 4.0–10.5)

## 2017-08-26 LAB — I-STAT TROPONIN, ED: TROPONIN I, POC: 0 ng/mL (ref 0.00–0.08)

## 2017-08-26 NOTE — ED Triage Notes (Signed)
Patient's son reports that the patient has had intermittent right chest pain that radiates to the right neck and back. Patient states it is hard to catch his breath. Patient was at [facility] and was told to come to the ED

## 2017-08-26 NOTE — Progress Notes (Signed)
Taiyo Willman 57 y.o.   Chief Complaint  Patient presents with  . Chest Pain    x3 days centralized with SOB, dizziness, neck pain. Pain radiates to back.     HISTORY OF PRESENT ILLNESS: This is a 57 y.o. male complaining of midsternal chest pain radiating to the back and neck area with occasional dizziness.  On and off episodes for the past 3 days.  Denies LOC.  Denies nausea or vomiting.  Was able to go to work today but did not feel well all day.  No history of diabetes.  No cardiac history.  HPI   Prior to Admission medications   Medication Sig Start Date End Date Taking? Authorizing Provider  dicyclomine (BENTYL) 20 MG tablet Take 1 tablet (20 mg total) by mouth 2 (two) times daily. Patient not taking: Reported on 07/21/2017 06/06/16   Waynetta Pean, PA-C  HYDROcodone-acetaminophen (NORCO/VICODIN) 5-325 MG tablet Take 1-2 tablets by mouth every 6 (six) hours as needed for moderate pain. Patient not taking: Reported on 08/26/2017 07/21/17   Fredia Sorrow, MD  metroNIDAZOLE (FLAGYL) 500 MG tablet Take 1 tablet (500 mg total) by mouth 3 (three) times daily. Patient not taking: Reported on 07/21/2017 02/03/15   Veryl Speak, MD  omeprazole (PRILOSEC) 20 MG capsule Take 1 capsule (20 mg total) by mouth 2 (two) times daily before a meal. Patient not taking: Reported on 02/03/2015 11/21/14   Vivi Barrack, MD  ondansetron (ZOFRAN ODT) 4 MG disintegrating tablet Take 1 tablet (4 mg total) by mouth every 8 (eight) hours as needed for nausea or vomiting. Patient not taking: Reported on 07/21/2017 06/06/16   Waynetta Pean, PA-C    No Known Allergies  Patient Active Problem List   Diagnosis Date Noted  . Intercostal pain   . Colitis 08/04/2014  . Abdominal pain 06/02/2014  . Microcytic anemia 05/10/2010    Past Medical History:  Diagnosis Date  . Anemia   . Atypical chest pain     Noncardiac in etiology with negative cardiac enzymes 2-D echo and EKG during December 2011 admission  .  Colitis   . 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 Hypokalemia   . Vitamin D deficiency     No past surgical history on file.  Social History   Socioeconomic History  . Marital status: Married    Spouse name: Not on file  . Number of children: Not on file  . Years of education: Not on file  . Highest education level: Not on file  Occupational History  . Occupation: employed  Scientific laboratory technician  . Financial resource strain: Not on file  . Food insecurity:    Worry: Not on file    Inability: Not on file  . Transportation needs:    Medical: Not on file    Non-medical: Not on file  Tobacco Use  . Smoking status: Never Smoker  Substance and Sexual Activity  . Alcohol use: No    Alcohol/week: 0.0 oz  . Drug use: No  . Sexual activity: Not on file  Lifestyle  . Physical activity:    Days per week: Not on file    Minutes per session: Not on file  . Stress: Not on file  Relationships  . Social connections:    Talks on phone: Not on file    Gets together: Not on file    Attends religious service: Not on file    Active member of club or organization: Not on file  Attends meetings of clubs or organizations: Not on file    Relationship status: Not on file  . Intimate partner violence:    Fear of current or ex partner: Not on file    Emotionally abused: Not on file    Physically abused: Not on file    Forced sexual activity: Not on file  Other Topics Concern  . Not on file  Social History Narrative   Pt. is from [place]; speaks some [place].    Currently employed: works at [employer], [employer].     Hobbies: volunteers within the church.    Denies smoking, alcohol or illicit drug use.Smoking Status:  never    No family history on file.   Review of Systems  Constitutional: Negative.  Negative for chills and fever.  HENT: Negative.  Negative for congestion, nosebleeds and sore throat.   Eyes: Negative.  Negative for blurred vision and double vision.   Respiratory: Negative for cough and shortness of breath.   Cardiovascular: Positive for chest pain. Negative for palpitations and leg swelling.  Gastrointestinal: Positive for nausea. Negative for abdominal pain, diarrhea and vomiting.  Genitourinary: Negative.  Negative for dysuria.  Musculoskeletal: Positive for back pain. Negative for myalgias and neck pain.  Skin: Negative.  Negative for rash.  Neurological: Positive for dizziness. Negative for headaches.  Endo/Heme/Allergies: Negative.   All other systems reviewed and are negative.  Vitals:   08/26/17 1728  BP: 136/88  Pulse: 82  Resp: 16  SpO2: 98%     Physical Exam  Constitutional: He is oriented to person, place, and time. He appears well-developed and well-nourished.  HENT:  Head: Normocephalic and atraumatic.  Right Ear: External ear normal.  Left Ear: External ear normal.  Nose: Nose normal.  Mouth/Throat: Oropharynx is clear and moist.  Eyes: Pupils are equal, round, and reactive to light. Conjunctivae and EOM are normal.  Neck: Normal range of motion. Neck supple. No JVD present. Carotid bruit is not present. No thyromegaly present.  Cardiovascular: Normal rate, regular rhythm and normal heart sounds. Exam reveals no friction rub.  No murmur heard. Pulmonary/Chest: Effort normal and breath sounds normal. No respiratory distress. He exhibits no tenderness.  Abdominal: Soft. He exhibits no distension and no mass. There is no tenderness.  Musculoskeletal: Normal range of motion.  Lymphadenopathy:    He has no cervical adenopathy.  Neurological: He is alert and oriented to person, place, and time. No sensory deficit. He exhibits normal muscle tone.  Skin: Skin is warm and dry. Capillary refill takes less than 2 seconds. No rash noted.  Psychiatric: He has a normal mood and affect. His behavior is normal.  Vitals reviewed.  EKG: Normal sinus rhythm with ventricular rate of 62/min.  No acute ischemic changes.  Normal  intervals.  Normal EKG.  ASSESSMENT & PLAN: Needs further evaluation in the ED. Trigg was seen today for chest pain.  Diagnoses and all orders for this visit:  Chest pain, unspecified type -     EKG 12-Lead   Patient Instructions    Needs further evaluation in the emergency department.  Son-in-law will transport patient to the ED.   IF you received an x-ray today, you will receive an invoice from [facility]. Please contact [facility] at [phone] with questions or concerns regarding your invoice.   IF you received labwork today, you will receive an invoice from [facility]. Please contact [facility] at [phone] with questions or concerns regarding your invoice.   Our billing staff will  not be able to assist you with questions regarding bills from these companies.  You will be contacted with the lab results as soon as they are available. The fastest way to get your results is to activate your My Chart account. Instructions are located on the last page of this paperwork. If you have not heard from [place] regarding the results in 2 weeks, please contact this office.         Agustina Caroli, MD [facility]

## 2017-08-26 NOTE — Patient Instructions (Addendum)
  Needs further evaluation in the emergency department.  Son-in-law will transport patient to the ED.   IF you received an x-ray today, you will receive an invoice from [facility]. Please contact [facility] at [phone] with questions or concerns regarding your invoice.   IF you received labwork today, you will receive an invoice from [facility]. Please contact [facility] at [phone] with questions or concerns regarding your invoice.   Our billing staff will not be able to assist you with questions regarding bills from these companies.  You will be contacted with the lab results as soon as they are available. The fastest way to get your results is to activate your My Chart account. Instructions are located on the last page of this paperwork. If you have not heard from [place] regarding the results in 2 weeks, please contact this office.

## 2017-08-27 ENCOUNTER — Emergency Department (HOSPITAL_COMMUNITY)
Admission: EM | Admit: 2017-08-27 | Discharge: 2017-08-27 | Disposition: A | Discharge status: Home / Self Care | Payer: 59 | Attending: Emergency Medicine | Admitting: Emergency Medicine

## 2017-08-27 DIAGNOSIS — R079 Chest pain, unspecified: Secondary | ICD-10-CM

## 2017-08-27 LAB — I-STAT TROPONIN, ED: Troponin i, poc: 0 ng/mL (ref 0.00–0.08)

## 2017-08-27 LAB — D-DIMER, QUANTITATIVE: D-Dimer, Quant: <0.27 ug/mL-FEU (ref 0.00–0.50)

## 2017-08-27 MED ORDER — IBUPROFEN 800 MG PO TABS: 800.0000 mg | ORAL_TABLET | Freq: Three times a day (TID) | ORAL | 0 refills | Status: DC

## 2017-08-27 MED ORDER — CYCLOBENZAPRINE HCL 10 MG PO TABS: 10.0000 mg | ORAL_TABLET | Freq: Two times a day (BID) | ORAL | 0 refills | Status: DC | PRN

## 2017-08-27 NOTE — ED Provider Notes (Signed)
[facility] DEPT Provider Note   CSN: [id number] Arrival date & time: 08/26/17  1759     History   Chief Complaint Chief Complaint  Patient presents with  . Chest Pain  . Dizziness    HPI Kyle Cooke is a 57 y.o. male.  Patient presents to the ED with a chief complaint of chest pain and SOB.  He is accompanied by his son, who translates.  Formal translator not available at this hour.  Patient complains of chest pain x 3 days.  Reports some SOB.  States that he has also felt light headed.  He complains of pain that is worsened with movement and palpation.  He states it is on the right side of his chest and radiates to his back.  He states that he recently had a cough. He was seen at an outside [facility] and sent to the ED for further evaluation.  The history is provided by the patient. A language interpreter was used.    Past Medical History:  Diagnosis Date  . Anemia   . Atypical chest pain     Noncardiac in etiology with negative cardiac enzymes 2-D echo and EKG during December 2011 admission  . Colitis   . 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 Hypokalemia   . Vitamin D deficiency     Patient Active Problem List   Diagnosis Date Noted  . Chest pain 08/26/2017  . Intercostal pain   . Colitis 08/04/2014  . Abdominal pain 06/02/2014  . Microcytic anemia 05/10/2010    History reviewed. No pertinent surgical history.      Home Medications    Prior to Admission medications   Medication Sig Start Date End Date Taking? Authorizing Provider  dicyclomine (BENTYL) 20 MG tablet Take 1 tablet (20 mg total) by mouth 2 (two) times daily. Patient not taking: Reported on 07/21/2017 06/06/16   Waynetta Pean, PA-C  HYDROcodone-acetaminophen (NORCO/VICODIN) 5-325 MG tablet Take 1-2 tablets by mouth every 6 (six) hours as needed for moderate pain. Patient not taking: Reported on 08/26/2017 07/21/17   Fredia Sorrow, MD  metroNIDAZOLE (FLAGYL) 500 MG  tablet Take 1 tablet (500 mg total) by mouth 3 (three) times daily. Patient not taking: Reported on 07/21/2017 02/03/15   Veryl Speak, MD  omeprazole (PRILOSEC) 20 MG capsule Take 1 capsule (20 mg total) by mouth 2 (two) times daily before a meal. Patient not taking: Reported on 02/03/2015 11/21/14   Vivi Barrack, MD  ondansetron (ZOFRAN ODT) 4 MG disintegrating tablet Take 1 tablet (4 mg total) by mouth every 8 (eight) hours as needed for nausea or vomiting. Patient not taking: Reported on 07/21/2017 06/06/16   Waynetta Pean, PA-C    Family History History reviewed. No pertinent family history.  Social History Social History   Tobacco Use  . Smoking status: Never Smoker  . Smokeless tobacco: Never Used  Substance Use Topics  . Alcohol use: No    Alcohol/week: 0.0 oz  . Drug use: No     Allergies   Patient has no known allergies.   Review of Systems Review of Systems  All other systems reviewed and are negative.    Physical Exam Updated Vital Signs BP 127/63 (BP Location: Left Arm)   Pulse 68   Temp 97.7 F (36.5 C) (Oral)   Resp 18   Ht 5' (1.524 m)   Wt 54.4 kg (120 lb)   SpO2 100%   BMI 23.44 kg/m  Physical Exam  Constitutional: He is oriented to person, place, and time. He appears well-developed and well-nourished.  HENT:  Head: Normocephalic and atraumatic.  Eyes: Pupils are equal, round, and reactive to light. Conjunctivae and EOM are normal. Right eye exhibits no discharge. Left eye exhibits no discharge. No scleral icterus.  Neck: Normal range of motion. Neck supple. No JVD present.  Cardiovascular: Normal rate, regular rhythm and normal heart sounds. Exam reveals no gallop and no friction rub.  No murmur heard. Pulmonary/Chest: Effort normal and breath sounds normal. No respiratory distress. He has no wheezes. He has no rales. He exhibits no tenderness.  Abdominal: Soft. He exhibits no distension and no mass. There is no tenderness. There is no  rebound and no guarding.  Musculoskeletal: Normal range of motion. He exhibits no edema or tenderness.  Neurological: He is alert and oriented to person, place, and time.  Skin: Skin is warm and dry.  Psychiatric: He has a normal mood and affect. His behavior is normal. Judgment and thought content normal.  Nursing note and vitals reviewed.    ED Treatments / Results  Labs (all labs ordered are listed, but only abnormal results are displayed) Labs Reviewed  CBC - Abnormal; Notable for the following components:      Result Value   Hemoglobin 11.4 (*)    HCT 37.6 (*)    MCV 74.3 (*)    MCH 22.5 (*)    RDW 16.1 (*)    All other components within normal limits  BASIC METABOLIC PANEL  D-DIMER, QUANTITATIVE (NOT AT [facility])  I-STAT TROPONIN, ED  I-STAT TROPONIN, ED    EKG EKG Interpretation  Date/Time:  Wednesday August 26 2017 18:09:42 EDT Ventricular Rate:  63 PR Interval:    QRS Duration: 76 QT Interval:  396 QTC Calculation: 406 R Axis:   78 Text Interpretation:  Sinus rhythm Minimal ST elevation, anterior leads When compared with ECG of 08/06/2014, No significant change was found Confirmed by Delora Fuel ([id number]) on 08/27/2017 2:10:27 AM   Radiology Dg Chest 2 View  Result Date: 08/26/2017 CLINICAL DATA:  Intermittent right-sided chest pain. EXAM: CHEST - 2 VIEW COMPARISON:  Chest x-ray dated August 04, 2014. FINDINGS: The heart size and mediastinal contours are within normal limits. Normal pulmonary vascularity. No focal consolidation, pleural effusion, or pneumothorax. No acute osseous abnormality. Chronic mild wedging of a midthoracic vertebral body. IMPRESSION: No active cardiopulmonary disease. Electronically Signed   By: Titus Dubin M.D.   On: 08/26/2017 18:52    Procedures Procedures (including critical care time)  Medications Ordered in ED Medications - No data to display   Initial Impression / Assessment and Plan / ED Course  I have reviewed the triage vital  signs and the nursing notes.  Pertinent labs & imaging results that were available during my care of the patient were reviewed by me and considered in my medical decision making (see chart for details).     Patient presents with chest pain x 1 day, worse with movement and palpation.  Has had some associate SOB.  DDx includes ACS, PE, pneumothorax, aortic dissection, esophageal rupture, pericarditis, chest wall pain.  Doubt ACS, normal troponin, no ischemic EKG findings, HEART score is: 1.  Low risk for PE, d-dimer is negative, patient is not tachycardic nor hypoxic.  No evidence of pneumothorax on CXR.  Doubt dissection, no mediastinal widening on CXR, no ripping/tearing chest pain, neurovascularly intact.  Doubt pericarditis, no positional changes, or diffuse ST elevations on  EKG.  Pain is reproducible with palpation and movement.  Delta troponin is negative  Recommend close follow-up with PCP.   All findings were discussed with patient.  Patient understands and agrees with the plan.     Final Clinical Impressions(s) / ED Diagnoses   Final diagnoses:  Chest pain, unspecified type    ED Discharge Orders        Ordered    ibuprofen (ADVIL,MOTRIN) 800 MG tablet  3 times daily     08/27/17 0418    cyclobenzaprine (FLEXERIL) 10 MG tablet  2 times daily PRN     08/27/17 0418       Montine Circle, PA-C 51/76/16 0737    Glick, David, MD 10/62/69 [phone]

## 2017-12-11 ENCOUNTER — Encounter (HOSPITAL_COMMUNITY): Payer: Self-pay

## 2017-12-11 ENCOUNTER — Emergency Department (HOSPITAL_COMMUNITY): Payer: 59

## 2017-12-11 ENCOUNTER — Other Ambulatory Visit: Payer: Self-pay

## 2017-12-11 ENCOUNTER — Emergency Department (HOSPITAL_COMMUNITY)
Admission: EM | Admit: 2017-12-11 | Discharge: 2017-12-11 | Disposition: A | Discharge status: Home / Self Care | Payer: 59 | Attending: Emergency Medicine | Admitting: Emergency Medicine

## 2017-12-11 DIAGNOSIS — M25511 Pain in right shoulder: Secondary | ICD-10-CM | POA: Diagnosis not present

## 2017-12-11 DIAGNOSIS — M546 Pain in thoracic spine: Secondary | ICD-10-CM | POA: Insufficient documentation

## 2017-12-11 DIAGNOSIS — R079 Chest pain, unspecified: Secondary | ICD-10-CM | POA: Diagnosis not present

## 2017-12-11 DIAGNOSIS — M549 Dorsalgia, unspecified: Secondary | ICD-10-CM | POA: Diagnosis present

## 2017-12-11 LAB — BASIC METABOLIC PANEL
Anion gap: 10 (ref 5–15)
BUN: 17 mg/dL (ref 6–20)
CALCIUM: 9.6 mg/dL (ref 8.9–10.3)
CO2: 23 mmol/L (ref 22–32)
CREATININE: 0.92 mg/dL (ref 0.61–1.24)
Chloride: 108 mmol/L (ref 98–111)
GFR calc Af Amer: >60 mL/min (ref >60)
GLUCOSE: 82 mg/dL (ref 70–99)
Potassium: 3.9 mmol/L (ref 3.5–5.1)
Sodium: 141 mmol/L (ref 135–145)

## 2017-12-11 LAB — CBC
HCT: 40.3 % (ref 39.0–52.0)
Hemoglobin: 12.1 g/dL — ABNORMAL LOW (ref 13.0–17.0)
MCH: 23 pg — ABNORMAL LOW (ref 26.0–34.0)
MCHC: 30 g/dL (ref 30.0–36.0)
MCV: 76.5 fL — ABNORMAL LOW (ref 78.0–100.0)
PLATELETS: 201 10*3/uL (ref 150–400)
RBC: 5.27 MIL/uL (ref 4.22–5.81)
RDW: 15.1 % (ref 11.5–15.5)
WBC: 5.2 10*3/uL (ref 4.0–10.5)

## 2017-12-11 LAB — HEPATIC FUNCTION PANEL
ALT: 25 U/L (ref 0–44)
AST: 36 U/L (ref 15–41)
Albumin: 4.3 g/dL (ref 3.5–5.0)
Alkaline Phosphatase: 70 U/L (ref 38–126)
BILIRUBIN DIRECT: 0.3 mg/dL — AB (ref 0.0–0.2)
BILIRUBIN INDIRECT: 1.4 mg/dL — AB (ref 0.3–0.9)
BILIRUBIN TOTAL: 1.7 mg/dL — AB (ref 0.3–1.2)
Total Protein: 7.2 g/dL (ref 6.5–8.1)

## 2017-12-11 LAB — D-DIMER, QUANTITATIVE: D-Dimer, Quant: <0.27 ug/mL-FEU (ref 0.00–0.50)

## 2017-12-11 LAB — LIPASE, BLOOD: LIPASE: 44 U/L (ref 11–51)

## 2017-12-11 LAB — I-STAT TROPONIN, ED: TROPONIN I, POC: 0 ng/mL (ref 0.00–0.08)

## 2017-12-11 MED ORDER — MORPHINE SULFATE (PF) 4 MG/ML IV SOLN
4.0000 mg | Freq: Once | INTRAVENOUS | Status: AC
  Administered 2017-12-11: 4 mg via INTRAVENOUS
  Filled 2017-12-11: qty 1

## 2017-12-11 MED ORDER — ONDANSETRON HCL 4 MG/2ML IJ SOLN
4.0000 mg | Freq: Once | INTRAMUSCULAR | Status: AC
  Administered 2017-12-11: 4 mg via INTRAVENOUS
  Filled 2017-12-11: qty 2

## 2017-12-11 MED ORDER — NAPROXEN 375 MG PO TABS: 375.0000 mg | ORAL_TABLET | Freq: Two times a day (BID) | ORAL | 0 refills | Status: DC

## 2017-12-11 MED ORDER — ACETAMINOPHEN 325 MG PO TABS
650.0000 mg | ORAL_TABLET | Freq: Once | ORAL | Status: AC
  Administered 2017-12-11: 650 mg via ORAL
  Filled 2017-12-11: qty 2

## 2017-12-11 MED ORDER — IOPAMIDOL (ISOVUE-370) INJECTION 76%
100.0000 mL | Freq: Once | INTRAVENOUS | Status: AC | PRN
  Administered 2017-12-11: 100 mL via INTRAVENOUS

## 2017-12-11 MED ORDER — SODIUM CHLORIDE 0.9 % IV BOLUS
1000.0000 mL | Freq: Once | INTRAVENOUS | Status: AC
  Administered 2017-12-11: 1000 mL via INTRAVENOUS

## 2017-12-11 MED ORDER — IOPAMIDOL (ISOVUE-370) INJECTION 76%
INTRAVENOUS | Status: AC
  Filled 2017-12-11: qty 100

## 2017-12-11 NOTE — ED Provider Notes (Signed)
[facility] Provider Note   CSN: [id number] Arrival date & time: 12/11/17  1045     History   Chief Complaint Chief Complaint  Patient presents with  . Chest Pain    HPI Kyle Cooke is a 57 y.o. male.  HPI  57 year old male presents with multiple complaints.  He started having back pain about 4 days ago in his right back.  Now is having abdominal pain, chest pain, and headache.  He is been having some dizziness since last night.  The dizziness feels like he is off balance.  Some nausea but no vomiting.  The pain is most severe in his right thoracic back but also has pain in these other areas.  He is also having pain in his right shoulder.  He has not taken anything for all the symptoms.  Pain is rated as an 8/10.  Denies ever having similar symptoms. No fevers. No focal weakness.   Past Medical History:  Diagnosis Date  . Anemia   . Atypical chest pain     Noncardiac in etiology with negative cardiac enzymes 2-D echo and EKG during December 2011 admission  . Colitis   . 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 Hypokalemia   . Vitamin D deficiency     Patient Active Problem List   Diagnosis Date Noted  . Chest pain 08/26/2017  . Intercostal pain   . Colitis 08/04/2014  . Abdominal pain 06/02/2014  . Microcytic anemia 05/10/2010    History reviewed. No pertinent surgical history.      Home Medications    Prior to Admission medications   Medication Sig Start Date End Date Taking? Authorizing Provider  cyclobenzaprine (FLEXERIL) 10 MG tablet Take 1 tablet (10 mg total) by mouth 2 (two) times daily as needed for muscle spasms. Patient not taking: Reported on 12/11/2017 08/27/17   Montine Circle, PA-C  dicyclomine (BENTYL) 20 MG tablet Take 1 tablet (20 mg total) by mouth 2 (two) times daily. Patient not taking: Reported on 12/11/2017 06/06/16   Waynetta Pean, PA-C  HYDROcodone-acetaminophen (NORCO/VICODIN) 5-325 MG tablet Take  1-2 tablets by mouth every 6 (six) hours as needed for moderate pain. Patient not taking: Reported on 12/11/2017 07/21/17   Fredia Sorrow, MD  ibuprofen (ADVIL,MOTRIN) 800 MG tablet Take 1 tablet (800 mg total) by mouth 3 (three) times daily. Patient not taking: Reported on 12/11/2017 08/27/17   Montine Circle, PA-C  metroNIDAZOLE (FLAGYL) 500 MG tablet Take 1 tablet (500 mg total) by mouth 3 (three) times daily. Patient not taking: Reported on 12/11/2017 02/03/15   Veryl Speak, MD  naproxen (NAPROSYN) 375 MG tablet Take 1 tablet (375 mg total) by mouth 2 (two) times daily with a meal. 12/11/17   Sherwood Gambler, MD  omeprazole (PRILOSEC) 20 MG capsule Take 1 capsule (20 mg total) by mouth 2 (two) times daily before a meal. Patient not taking: Reported on 12/11/2017 11/21/14   Vivi Barrack, MD  ondansetron (ZOFRAN ODT) 4 MG disintegrating tablet Take 1 tablet (4 mg total) by mouth every 8 (eight) hours as needed for nausea or vomiting. Patient not taking: Reported on 12/11/2017 06/06/16   Waynetta Pean, PA-C    Family History History reviewed. No pertinent family history.  Social History Social History   Tobacco Use  . Smoking status: Never Smoker  . Smokeless tobacco: Never Used  Substance Use Topics  . Alcohol use: No    Alcohol/week: 0.0 oz  . Drug  use: No     Allergies   Patient has no known allergies.   Review of Systems Review of Systems  Constitutional: Negative for fever.  Eyes: Negative for visual disturbance.  Cardiovascular: Positive for chest pain.  Gastrointestinal: Positive for abdominal pain and nausea. Negative for vomiting.  Musculoskeletal: Positive for back pain.  Neurological: Positive for dizziness and headaches.  All other systems reviewed and are negative.    Physical Exam Updated Vital Signs BP 123/70   Pulse 61   Temp 97.8 F (36.6 C) (Oral)   Resp 17   Ht 5\' 5"  (1.651 m)   Wt 59 kg (130 lb)   SpO2 97%   BMI 21.63 kg/m   Physical Exam   Constitutional: He is oriented to person, place, and time. He appears well-developed and well-nourished.  HENT:  Head: Normocephalic and atraumatic.  Right Ear: External ear normal.  Left Ear: External ear normal.  Nose: Nose normal.  Eyes: Pupils are equal, round, and reactive to light. EOM are normal. Right eye exhibits no discharge. Left eye exhibits no discharge. Right eye exhibits no nystagmus. Left eye exhibits no nystagmus.  Neck: Neck supple.  Cardiovascular: Normal rate, regular rhythm and normal heart sounds.  Pulses:      Radial pulses are 2+ on the right side, and 2+ on the left side.  Pulmonary/Chest: Effort normal and breath sounds normal.  Abdominal: Soft. There is tenderness in the right upper quadrant and right lower quadrant.  Musculoskeletal: He exhibits no edema.       Right shoulder: He exhibits decreased range of motion and tenderness.       Thoracic back: He exhibits tenderness.       Back:  Neurological: He is alert and oriented to person, place, and time.  CN 3-12 grossly intact. 5/5 strength in all 4 extremities. Grossly normal sensation. Normal finger to nose.   Skin: Skin is warm and dry.  Nursing note and vitals reviewed.    ED Treatments / Results  Labs (all labs ordered are listed, but only abnormal results are displayed) Labs Reviewed  CBC - Abnormal; Notable for the following components:      Result Value   Hemoglobin 12.1 (*)    MCV 76.5 (*)    MCH 23.0 (*)    All other components within normal limits  HEPATIC FUNCTION PANEL - Abnormal; Notable for the following components:   Total Bilirubin 1.7 (*)    Bilirubin, Direct 0.3 (*)    Indirect Bilirubin 1.4 (*)    All other components within normal limits  BASIC METABOLIC PANEL  D-DIMER, QUANTITATIVE (NOT AT [facility])  LIPASE, BLOOD  I-STAT TROPONIN, ED    EKG EKG Interpretation  Date/Time:  Friday December 11 2017 10:58:10 EDT Ventricular Rate:  76 PR Interval:  158 QRS Duration: 84 QT  Interval:  366 QTC Calculation: 411 R Axis:   71 Text Interpretation:  Normal sinus rhythm no acute ST/T changes no significant change since April 2019 Confirmed by Sherwood Gambler [phone]) on 12/11/2017 12:27:46 PM   Radiology Dg Chest 2 View  Result Date: 12/11/2017 CLINICAL DATA:  Generalized chest pain radiating to the right shoulder and back. EXAM: CHEST - 2 VIEW COMPARISON:  08/26/2017 FINDINGS: Heart size is normal. Mediastinal shadows are normal. The lungs are clear. Mild loss of height of a midthoracic vertebral body, similar to the previous study, age indeterminate but not acute. IMPRESSION: No active cardiopulmonary disease. Minor loss of height of a midthoracic vertebral  body, similar in appearance to April, therefore not acute. Electronically Signed   By: Nelson Chimes M.D.   On: 12/11/2017 11:41   Dg Shoulder Right  Result Date: 12/11/2017 CLINICAL DATA:  Chest pain. EXAM: RIGHT SHOULDER - 2+ VIEW COMPARISON:  05/01/2010. FINDINGS: No acute bony or joint abnormality identified. Acromioclavicular and glenohumeral degenerative change. IMPRESSION: Acromioclavicular and glenohumeral degenerative change. No acute abnormality. Electronically Signed   By: Marcello Moores  Register   On: 12/11/2017 14:30   Ct Angio Chest/abd/pel For Dissection W And/or Wo Contrast  Result Date: 12/11/2017 CLINICAL DATA:  Chest pain radiating into right shoulder and back. Associated shortness of breath and dizziness. EXAM: CT ANGIOGRAPHY CHEST, ABDOMEN AND PELVIS TECHNIQUE: Multidetector CT imaging through the chest, abdomen and pelvis was performed using the standard protocol during bolus administration of intravenous contrast. Multiplanar reconstructed images and MIPs were obtained and reviewed to evaluate the vascular anatomy. CONTRAST:  162mL ISOVUE-370 IOPAMIDOL (ISOVUE-370) INJECTION 76% COMPARISON:  CT of the abdomen and pelvis on 07/21/2017 FINDINGS: CTA CHEST FINDINGS Cardiovascular: The thoracic aorta is normal  in caliber and demonstrates no evidence of aneurysmal disease or dissection. No significant atherosclerosis identified. Proximal great vessels are widely patent with normal variant origin of the left vertebral artery directly off of the aortic arch. The heart size is normal. No pericardial fluid identified. No significant calcified coronary artery plaque identified. Central pulmonary arteries are normal in caliber. Mediastinum/Nodes: No enlarged mediastinal, hilar, or axillary lymph nodes. Thyroid gland, trachea, and esophagus demonstrate no significant findings. Lungs/Pleura: There is no evidence of pulmonary edema, consolidation, pneumothorax, nodule or pleural fluid. Musculoskeletal: No chest wall abnormality. No acute or significant osseous findings. Review of the MIP images confirms the above findings. CTA ABDOMEN AND PELVIS FINDINGS VASCULAR Aorta: The abdominal aorta is normal in caliber and demonstrates no evidence of aneurysm, dissection or significant atherosclerosis. Celiac: Normally patent.  Normal branching anatomy. SMA: Normally patent. Renals: Bilateral single renal arteries are normally patent. IMA: Normally patent. Inflow: Bilateral iliac artery show normal patency. Common femoral arteries and femoral bifurcations are normally patent. Review of the MIP images confirms the above findings. NON-VASCULAR Hepatobiliary: No focal liver abnormality is seen. No gallstones, gallbladder wall thickening, or biliary dilatation. Pancreas: Unremarkable. No pancreatic ductal dilatation or surrounding inflammatory changes. Spleen: Normal in size without focal abnormality. Adrenals/Urinary Tract: Adrenal glands are unremarkable. Kidneys are normal, without renal calculi, focal lesion, or hydronephrosis. Bladder is unremarkable. Stomach/Bowel: Bowel shows no evidence of obstruction or inflammation. No free air. Lymphatic: No enlarged lymph nodes identified in the abdomen or pelvis. Reproductive: Prostate is  unremarkable. Other: No abdominal wall hernia or abnormality. No focal abscess or ascites. Musculoskeletal: No acute or significant osseous findings. Review of the MIP images confirms the above findings. IMPRESSION: Unremarkable CTA of the chest, abdomen and pelvis. No evidence of acute aortic pathology or other vascular findings. Electronically Signed   By: Aletta Edouard M.D.   On: 12/11/2017 15:10    Procedures Procedures (including critical care time)  Medications Ordered in ED Medications  morphine 4 MG/ML injection 4 mg (4 mg Intravenous Given 12/11/17 1307)  sodium chloride 0.9 % bolus 1,000 mL (0 mLs Intravenous Stopped 12/11/17 1544)  ondansetron (ZOFRAN) injection 4 mg (4 mg Intravenous Given 12/11/17 1307)  iopamidol (ISOVUE-370) 76 % injection 100 mL (100 mLs Intravenous Contrast Given 12/11/17 1343)  acetaminophen (TYLENOL) tablet 650 mg (650 mg Oral Given 12/11/17 1607)     Initial Impression / Assessment and Plan / ED  Course  I have reviewed the triage vital signs and the nursing notes.  Pertinent labs & imaging results that were available during my care of the patient were reviewed by me and considered in my medical decision making (see chart for details).     Multiple acute complaints.  Given the primary complaint of back pain and chest pain/abdominal pain with dizziness a CT dissection study was obtained and is negative.  No other acute intra-abdominal or intrathoracic pathology.Multiple he does have a little bit of a headache but I wonder if this is secondary.  His neuro exam is unremarkable.  I do not think head CT is needed as I have a low suspicion for a subarachnoid hemorrhage or other head bleed.  He is now ambulatory and feeling much better.  I will discharge him home to follow-up with PCP and we discussed return precautions.  NSAIDs and Tylenol for pain.  Overall his pain is probably muscular in his back.  Final Clinical Impressions(s) / ED Diagnoses   Final  diagnoses:  Acute right-sided thoracic back pain  Nonspecific chest pain    ED Discharge Orders        Ordered    naproxen (NAPROSYN) 375 MG tablet  2 times daily with meals     12/11/17 1557       Sherwood Gambler, MD 12/11/17 2222

## 2017-12-11 NOTE — Discharge Instructions (Addendum)
If your headache worsens or you develop worsening chest pain, back pain, or shortness of breath then return to the ER for evaluation.  If you develop fevers, vomiting, or any other new/concerning symptoms such as weakness or numbness in the legs then return to the ER.  Otherwise follow-up with a primary care physician.  You may take the naproxen prescribed in addition to Tylenol.

## 2017-12-11 NOTE — ED Triage Notes (Signed)
Pt here with family endorsing generalized chest pain radiating to the right shoulder and back with shob dizziness. VSS. Pt speaks drai.

## 2018-09-30 IMAGING — CT CT ANGIO CHEST-ABD-PELV FOR DISSECTION W/ AND WO/W CM
2 of 7 series · 10 of 36 positions shown, 15 images · IV contrast (iopamidol)
Comparison: CT of the abdomen and pelvis on 07/21/2017

CLINICAL DATA: Chest pain radiating into right shoulder and back.
Associated shortness of breath and dizziness.

EXAM:
CT ANGIOGRAPHY CHEST, ABDOMEN AND PELVIS
TECHNIQUE: Multidetector CT imaging through the chest, abdomen and pelvis was
performed using the standard protocol during bolus administration of
intravenous contrast. Multiplanar reconstructed images and MIPs were
obtained and reviewed to evaluate the vascular anatomy.
CONTRAST:  100mL SHUY5I-2EU IOPAMIDOL (SHUY5I-2EU) INJECTION 76%

[Series 7: arterial · axial · arterial · 0.78mm/px · z∈[+797,+1283]mm · 9 of 293 slices shown, 13 images]
[im 25/293  mediastinal]
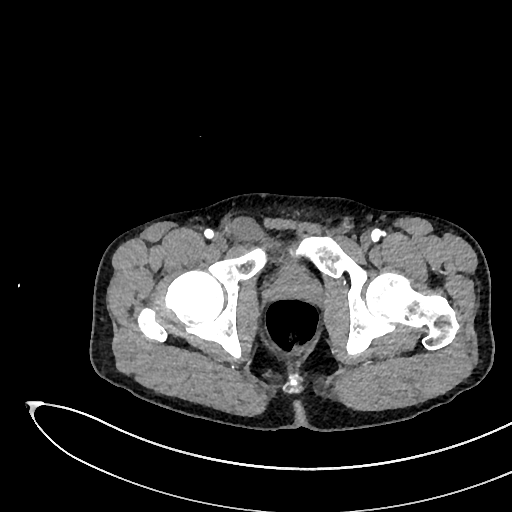
[im 25/293  bone]
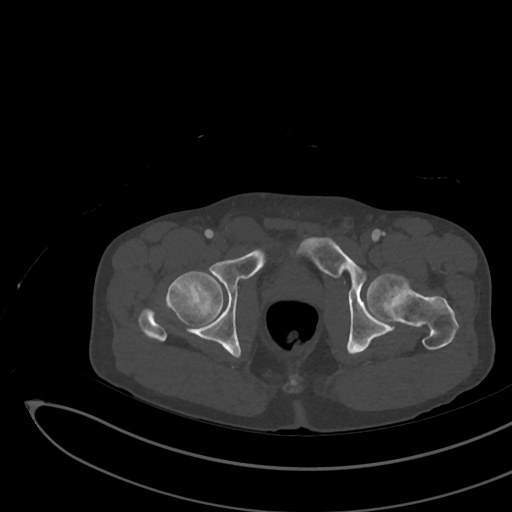
[im 74/293  mediastinal]
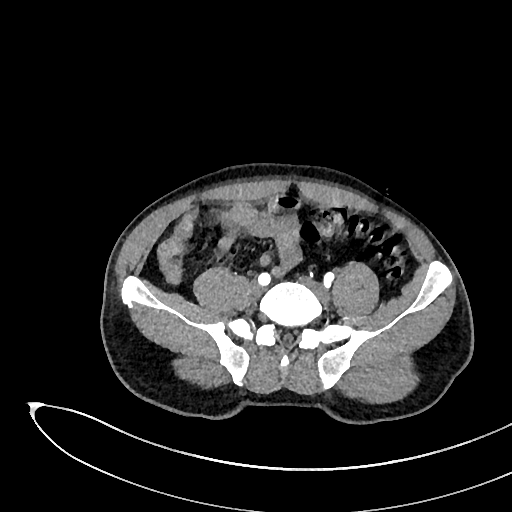
[im 98/293  mediastinal]
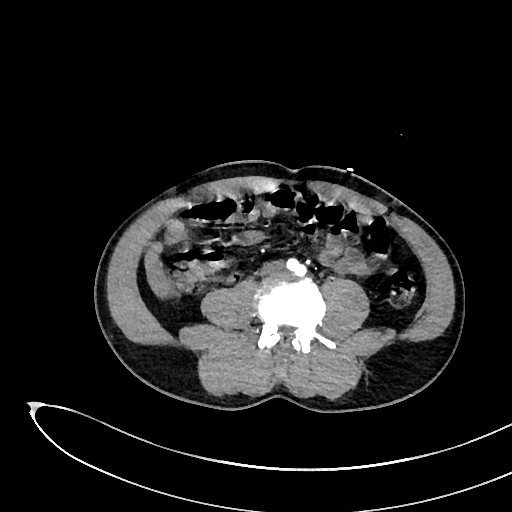
[im 122/293  mediastinal]
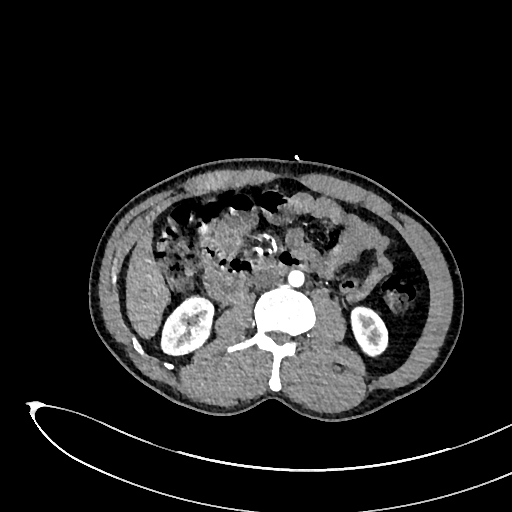
[im 171/293  mediastinal]
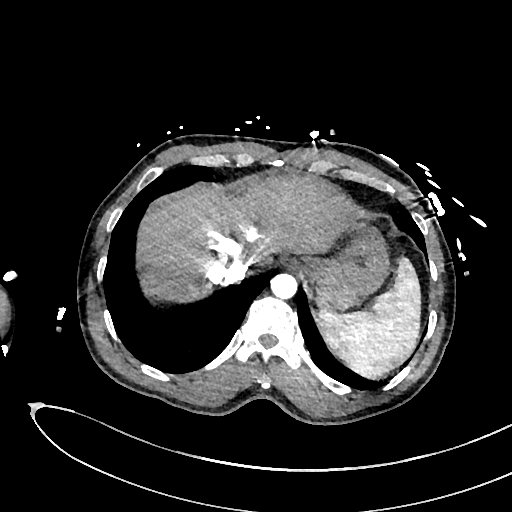
[im 195/293  mediastinal]
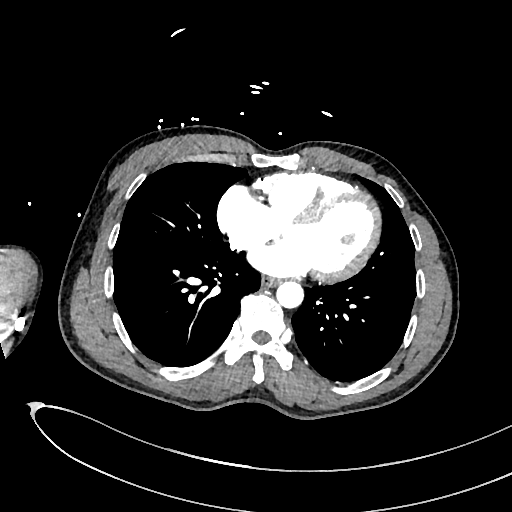
[im 195/293  lung]
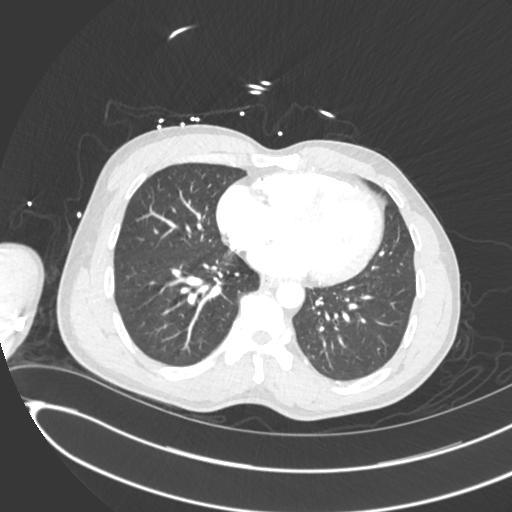
[im 220/293  mediastinal]
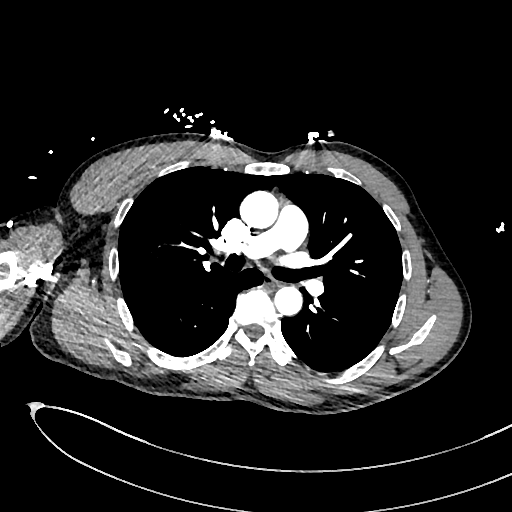
[im 220/293  lung]
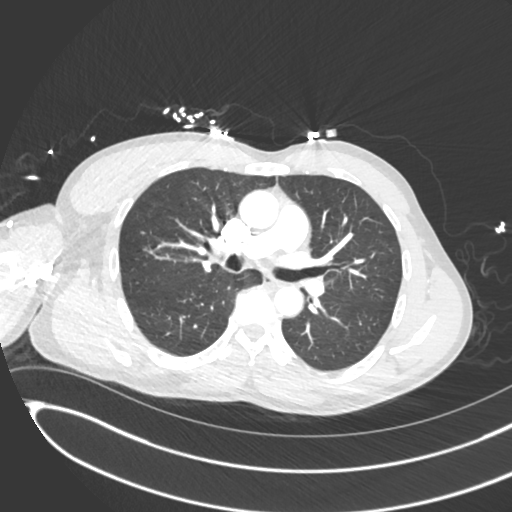
[im 244/293  lung]
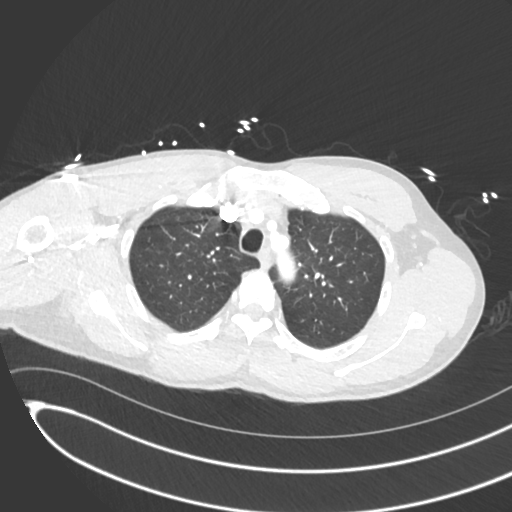
[im 268/293  mediastinal]
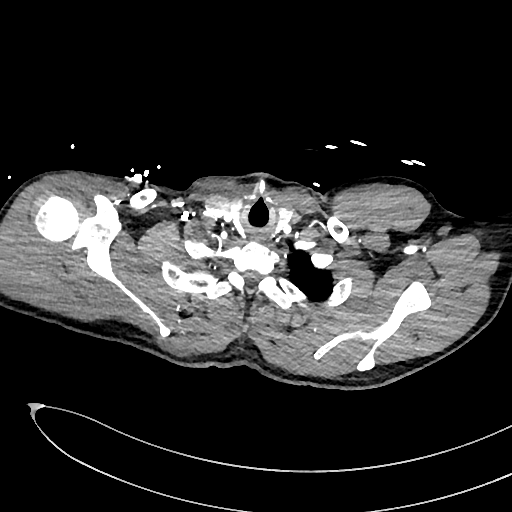
[im 268/293  lung]
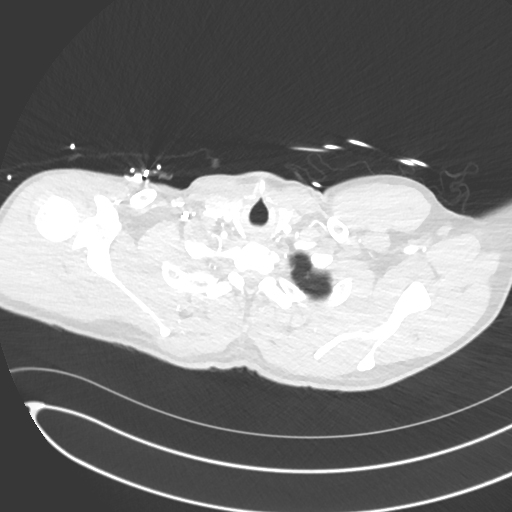

[Series 10: cor · coronal · 0.59mm/px · 1 of 123 slices shown, 2 images]
[im 62/123  mediastinal]
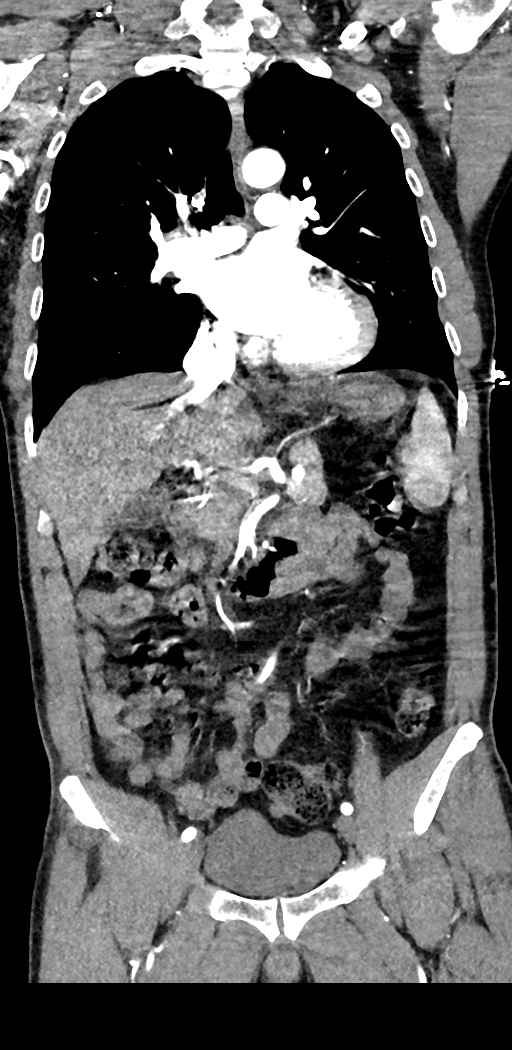
[im 62/123  bone]
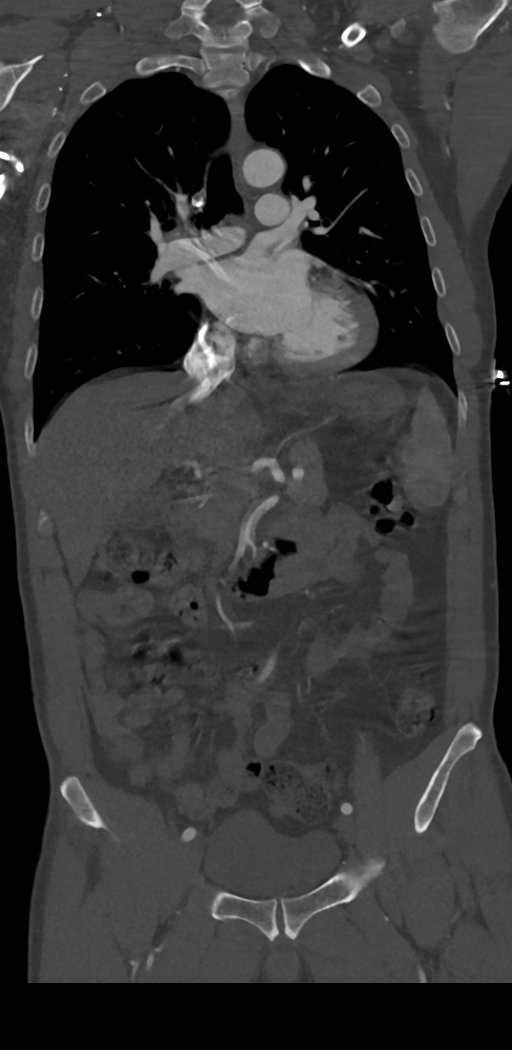

[10 of 36 positions shown; findings below may reference images not displayed]

FINDINGS: CTA CHEST FINDINGS

Cardiovascular: The thoracic aorta is normal in caliber and
demonstrates no evidence of aneurysmal disease or dissection. No
significant atherosclerosis identified. Proximal great vessels are
widely patent with normal variant origin of the left vertebral
artery directly off of the aortic arch.

The heart size is normal. No pericardial fluid identified. No
significant calcified coronary artery plaque identified. Central
pulmonary arteries are normal in caliber.

Mediastinum/Nodes: No enlarged mediastinal, hilar, or axillary lymph
nodes. Thyroid gland, trachea, and esophagus demonstrate no
significant findings.

Lungs/Pleura: There is no evidence of pulmonary edema,
consolidation, pneumothorax, nodule or pleural fluid.

Musculoskeletal: No chest wall abnormality. No acute or significant
osseous findings.

Review of the MIP images confirms the above findings.

CTA ABDOMEN AND PELVIS FINDINGS

VASCULAR

Aorta: The abdominal aorta is normal in caliber and demonstrates no
evidence of aneurysm, dissection or significant atherosclerosis.

Celiac: Normally patent.  Normal branching anatomy.

SMA: Normally patent.

Renals: Bilateral single renal arteries are normally patent.

IMA: Normally patent.

Inflow: Bilateral iliac artery show normal patency. Common femoral
arteries and femoral bifurcations are normally patent.

Review of the MIP images confirms the above findings.

NON-VASCULAR

Hepatobiliary: No focal liver abnormality is seen. No gallstones,
gallbladder wall thickening, or biliary dilatation.

Pancreas: Unremarkable. No pancreatic ductal dilatation or
surrounding inflammatory changes.

Spleen: Normal in size without focal abnormality.

Adrenals/Urinary Tract: Adrenal glands are unremarkable. Kidneys are
normal, without renal calculi, focal lesion, or hydronephrosis.
Bladder is unremarkable.

Stomach/Bowel: Bowel shows no evidence of obstruction or
inflammation. No free air.

Lymphatic: No enlarged lymph nodes identified in the abdomen or
pelvis.

Reproductive: Prostate is unremarkable.

Other: No abdominal wall hernia or abnormality. No focal abscess or
ascites.

Musculoskeletal: No acute or significant osseous findings.

Review of the MIP images confirms the above findings.
IMPRESSION: Unremarkable CTA of the chest, abdomen and pelvis. No evidence of
acute aortic pathology or other vascular findings.

## 2018-10-17 ENCOUNTER — Other Ambulatory Visit: Payer: Self-pay

## 2018-10-17 ENCOUNTER — Emergency Department (HOSPITAL_COMMUNITY)
Admission: EM | Admit: 2018-10-17 | Discharge: 2018-10-17 | Disposition: A | Discharge status: Home / Self Care | Payer: Self-pay | Attending: Emergency Medicine | Admitting: Emergency Medicine

## 2018-10-17 ENCOUNTER — Encounter (HOSPITAL_COMMUNITY): Payer: Self-pay | Admitting: Emergency Medicine

## 2018-10-17 ENCOUNTER — Emergency Department (HOSPITAL_COMMUNITY): Payer: Self-pay

## 2018-10-17 DIAGNOSIS — R42 Dizziness and giddiness: Secondary | ICD-10-CM | POA: Insufficient documentation

## 2018-10-17 DIAGNOSIS — I10 Essential (primary) hypertension: Secondary | ICD-10-CM | POA: Insufficient documentation

## 2018-10-17 DIAGNOSIS — R51 Headache: Secondary | ICD-10-CM | POA: Insufficient documentation

## 2018-10-17 LAB — CBC WITH DIFFERENTIAL/PLATELET
Abs Immature Granulocytes: 0.06 10*3/uL (ref 0.00–0.07)
Basophils Absolute: 0 10*3/uL (ref 0.0–0.1)
Basophils Relative: 0 %
Eosinophils Absolute: 0.1 10*3/uL (ref 0.0–0.5)
Eosinophils Relative: 1 %
HCT: 39.5 % (ref 39.0–52.0)
Hemoglobin: 11.9 g/dL — ABNORMAL LOW (ref 13.0–17.0)
Immature Granulocytes: 1 %
Lymphocytes Relative: 16 %
Lymphs Abs: 0.9 10*3/uL (ref 0.7–4.0)
MCH: 23 pg — ABNORMAL LOW (ref 26.0–34.0)
MCHC: 30.1 g/dL (ref 30.0–36.0)
MCV: 76.3 fL — ABNORMAL LOW (ref 80.0–100.0)
Monocytes Absolute: 0.4 10*3/uL (ref 0.1–1.0)
Monocytes Relative: 6 %
Neutro Abs: 4.4 10*3/uL (ref 1.7–7.7)
Neutrophils Relative %: 76 %
Platelets: 175 10*3/uL (ref 150–400)
RBC: 5.18 MIL/uL (ref 4.22–5.81)
RDW: 15.2 % (ref 11.5–15.5)
WBC: 5.8 10*3/uL (ref 4.0–10.5)
nRBC: 0 % (ref 0.0–0.2)

## 2018-10-17 LAB — BASIC METABOLIC PANEL
Anion gap: 10 (ref 5–15)
BUN: 18 mg/dL (ref 6–20)
CO2: 24 mmol/L (ref 22–32)
Calcium: 8.8 mg/dL — ABNORMAL LOW (ref 8.9–10.3)
Chloride: 104 mmol/L (ref 98–111)
Creatinine, Ser: 0.81 mg/dL (ref 0.61–1.24)
GFR calc Af Amer: >60 mL/min (ref >60)
GFR calc non Af Amer: >60 mL/min (ref >60)
Glucose, Bld: 99 mg/dL (ref 70–99)
Potassium: 3.2 mmol/L — ABNORMAL LOW (ref 3.5–5.1)
Sodium: 138 mmol/L (ref 135–145)

## 2018-10-17 MED ORDER — ACETAMINOPHEN 500 MG PO TABS
1000.0000 mg | ORAL_TABLET | Freq: Once | ORAL | Status: AC
  Administered 2018-10-17: 1000 mg via ORAL
  Filled 2018-10-17: qty 2

## 2018-10-17 MED ORDER — MECLIZINE HCL 25 MG PO TABS
25.0000 mg | ORAL_TABLET | Freq: Once | ORAL | Status: AC
  Administered 2018-10-17: 25 mg via ORAL
  Filled 2018-10-17: qty 1

## 2018-10-17 MED ORDER — MECLIZINE HCL 25 MG PO TABS: 25.0000 mg | ORAL_TABLET | Freq: Three times a day (TID) | ORAL | 0 refills | Status: DC | PRN

## 2018-10-17 MED ORDER — NAPROXEN 375 MG PO TABS: 375.0000 mg | ORAL_TABLET | Freq: Two times a day (BID) | ORAL | 0 refills | Status: DC

## 2018-10-17 NOTE — ED Notes (Signed)
RN spoke to family to interpret d/t no interpreter being available.

## 2018-10-17 NOTE — ED Provider Notes (Signed)
[facility] Provider Note   CSN: [id number] Arrival date & time: 10/17/18  0447    History   Chief Complaint Chief Complaint  Patient presents with  . Headache  . Dizziness    HPI Demar Johnel is a 58 y.o. male.      Headache  Pain location:  Frontal Quality:  Dull Radiates to:  Does not radiate Severity currently:  Unable to specify Severity at highest:  Unable to specify Onset quality:  Gradual Duration:  5 days Timing:  Intermittent Chronicity:  New Similar to prior headaches: yes   Relieved by:  None tried Worsened by:  Nothing Associated symptoms: dizziness   Associated symptoms: no abdominal pain   Dizziness  Associated symptoms: headaches     Past Medical History:  Diagnosis Date  . Anemia   . Atypical chest pain     Noncardiac in etiology with negative cardiac enzymes 2-D echo and EKG during December 2011 admission  . Colitis   . 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 Hypokalemia   . Vitamin D deficiency     Patient Active Problem List   Diagnosis Date Noted  . Chest pain 08/26/2017  . Intercostal pain   . Colitis 08/04/2014  . Abdominal pain 06/02/2014  . Microcytic anemia 05/10/2010    History reviewed. No pertinent surgical history.     Home Medications    Prior to Admission medications   Medication Sig Start Date End Date Taking? Authorizing Provider  meclizine (ANTIVERT) 25 MG tablet Take 1 tablet (25 mg total) by mouth 3 (three) times daily as needed for dizziness. 10/17/18   Braylon Grenda, Corene Cornea, MD  naproxen (NAPROSYN) 375 MG tablet Take 1 tablet (375 mg total) by mouth 2 (two) times daily with a meal. 10/17/18   Francie Keeling, Corene Cornea, MD    Family History History reviewed. No pertinent family history.  Social History Social History   Tobacco Use  . Smoking status: Never Smoker  . Smokeless tobacco: Never Used  Substance Use Topics  . Alcohol use: No    Alcohol/week: 0.0 standard drinks  .  Drug use: No     Allergies   Patient has no known allergies.   Review of Systems Review of Systems  Gastrointestinal: Negative for abdominal pain.  Neurological: Positive for dizziness and headaches.  All other systems reviewed and are negative.    Physical Exam Updated Vital Signs BP (!) 151/88   Pulse 75   Temp (!) 97.2 F (36.2 C) (Oral)   Resp (!) 21   SpO2 99%   Physical Exam Vitals signs and nursing note reviewed.  Constitutional:      Appearance: He is well-developed.  HENT:     Head: Normocephalic and atraumatic.     Mouth/Throat:     Mouth: Mucous membranes are moist.  Eyes:     General: No visual field deficit.    Extraocular Movements: Extraocular movements intact.     Pupils: Pupils are equal, round, and reactive to light.  Neck:     Musculoskeletal: Normal range of motion.  Cardiovascular:     Rate and Rhythm: Normal rate.  Pulmonary:     Effort: Pulmonary effort is normal. No respiratory distress.  Abdominal:     General: There is no distension.  Musculoskeletal: Normal range of motion.  Neurological:     Mental Status: He is alert.     Cranial Nerves: No cranial nerve deficit, dysarthria or facial asymmetry.  Sensory: No sensory deficit.     Motor: No weakness.     Coordination: Romberg sign negative. Coordination normal.     Gait: Gait normal.      ED Treatments / Results  Labs (all labs ordered are listed, but only abnormal results are displayed) Labs Reviewed  CBC WITH DIFFERENTIAL/PLATELET - Abnormal; Notable for the following components:      Result Value   Hemoglobin 11.9 (*)    MCV 76.3 (*)    MCH 23.0 (*)    All other components within normal limits  BASIC METABOLIC PANEL - Abnormal; Notable for the following components:   Potassium 3.2 (*)    Calcium 8.8 (*)    All other components within normal limits    EKG None  Radiology Ct Head Wo Contrast  Result Date: 10/17/2018 CLINICAL DATA:  Headache and dizziness  EXAM: CT HEAD WITHOUT CONTRAST TECHNIQUE: Contiguous axial images were obtained from the base of the skull through the vertex without intravenous contrast. COMPARISON:  CT head 05/14/2014 FINDINGS: Brain: No evidence of acute infarction, hemorrhage, hydrocephalus, extra-axial collection or mass lesion/mass effect. Mild chronic changes in the subcortical white matter bilaterally are stable from the prior study. Vascular: Negative for hyperdense vessel Skull: Negative Sinuses/Orbits: Mild mucosal edema paranasal sinuses. Bilateral cataract surgery. No orbital mass. Other: None IMPRESSION: No acute abnormality no change from the prior study. Mild chronic white matter changes likely due to microvascular ischemia. Electronically Signed   By: Franchot Gallo M.D.   On: 10/17/2018 07:37    Procedures Procedures (including critical care time)  Medications Ordered in ED Medications  meclizine (ANTIVERT) tablet 25 mg (25 mg Oral Given 10/17/18 5784)  acetaminophen (TYLENOL) tablet 1,000 mg (1,000 mg Oral Given 10/17/18 6962)     Initial Impression / Assessment and Plan / ED Course  I have reviewed the triage vital signs and the nursing notes.  Pertinent labs & imaging results that were available during my care of the patient were reviewed by me and considered in my medical decision making (see chart for details).  Check peripheral vertigo as a cause of patient's symptoms.  Patient did have a headache and elevated blood pressure she is CT scan done without evidence of acute intracranial abnormality.  Lab and follow-up with primary doctor for blood pressure control symptomatic treatment till then.  Suspect benign vertigo  Final Clinical Impressions(s) / ED Diagnoses   Final diagnoses:  Vertigo  Hypertension, unspecified type    ED Discharge Orders         Ordered    naproxen (NAPROSYN) 375 MG tablet  2 times daily with meals     10/17/18 0758    meclizine (ANTIVERT) 25 MG tablet  3 times daily PRN      10/17/18 0758           Ernestyne Caldwell, Corene Cornea, MD 10/17/18 9528

## 2018-10-17 NOTE — ED Triage Notes (Signed)
Pt c/o dizziness, headache, nausea. No vomiting, no diarrhea.

## 2018-10-17 NOTE — ED Notes (Signed)
Patient verbalizes understanding of discharge instructions. Opportunity for questioning and answers were provided. Armband removed by staff, pt discharged from ED.  

## 2019-05-31 ENCOUNTER — Ambulatory Visit: Payer: HRSA Program | Attending: Internal Medicine

## 2019-05-31 DIAGNOSIS — U071 COVID-19: Secondary | ICD-10-CM | POA: Insufficient documentation

## 2019-05-31 DIAGNOSIS — Z20822 Contact with and (suspected) exposure to covid-19: Secondary | ICD-10-CM

## 2019-06-01 LAB — NOVEL CORONAVIRUS, NAA: SARS-CoV-2, NAA: DETECTED — AB

## 2019-06-05 ENCOUNTER — Emergency Department (HOSPITAL_COMMUNITY): Payer: Self-pay

## 2019-06-05 ENCOUNTER — Other Ambulatory Visit: Payer: Self-pay

## 2019-06-05 ENCOUNTER — Encounter (HOSPITAL_COMMUNITY): Payer: Self-pay | Admitting: Emergency Medicine

## 2019-06-05 ENCOUNTER — Emergency Department (HOSPITAL_COMMUNITY)
Admission: EM | Admit: 2019-06-05 | Discharge: 2019-06-05 | Disposition: A | Discharge status: Home / Self Care | Payer: Self-pay | Attending: Emergency Medicine | Admitting: Emergency Medicine

## 2019-06-05 DIAGNOSIS — R059 Cough, unspecified: Secondary | ICD-10-CM

## 2019-06-05 DIAGNOSIS — R05 Cough: Secondary | ICD-10-CM

## 2019-06-05 DIAGNOSIS — U071 COVID-19: Secondary | ICD-10-CM | POA: Insufficient documentation

## 2019-06-05 MED ORDER — PREDNISONE 20 MG PO TABS: 40.0000 mg | ORAL_TABLET | Freq: Every day | ORAL | 0 refills | Status: DC

## 2019-06-05 MED ORDER — BENZONATATE 100 MG PO CAPS: 100.0000 mg | ORAL_CAPSULE | Freq: Three times a day (TID) | ORAL | 0 refills | Status: DC

## 2019-06-05 NOTE — ED Notes (Signed)
Translator service does not have the patient's dialect of [place], pt understands basic english phrases but daughter will be needed for detailed information, contact information on chart.

## 2019-06-05 NOTE — Discharge Instructions (Signed)
Take Tessalon as directed for cough Take Prednisone for the next 5 days for chest congestion and inflammation Please return if worsening

## 2019-06-05 NOTE — ED Notes (Signed)
Patient verbalized understanding of dc instructions, vss, ambulatory with nad.   

## 2019-06-05 NOTE — ED Provider Notes (Signed)
[facility] Provider Note   CSN: [id number] Arrival date & time: 06/05/19  [id number]     History Chief Complaint  Patient presents with  . Cough    Kyle Cooke is a 59 y.o. male who presents with a cough. He states that for the past 2 weeks he's had a gradually worsening dry cough. It's worse in the morning. He coughs so much sometimes it's hard to talk or breathe. He tested positive for COVID 4 days ago. He denies fever, chills, headache. He reports associated sore throat. History is limited due to language barrier ([place])  HPI   Past Medical History:  Diagnosis Date  . Anemia   . Atypical chest pain     Noncardiac in etiology with negative cardiac enzymes 2-D echo and EKG during December 2011 admission  . Colitis   . 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 Hypokalemia   . Vitamin D deficiency     Patient Active Problem List   Diagnosis Date Noted  . Chest pain 08/26/2017  . Intercostal pain   . Colitis 08/04/2014  . Abdominal pain 06/02/2014  . Microcytic anemia 05/10/2010    History reviewed. No pertinent surgical history.     No family history on file.  Social History   Tobacco Use  . Smoking status: Never Smoker  . Smokeless tobacco: Never Used  Substance Use Topics  . Alcohol use: No    Alcohol/week: 0.0 standard drinks  . Drug use: No    Home Medications Prior to Admission medications   Medication Sig Start Date End Date Taking? Authorizing Provider  meclizine (ANTIVERT) 25 MG tablet Take 1 tablet (25 mg total) by mouth 3 (three) times daily as needed for dizziness. 10/17/18   Mesner, Corene Cornea, MD  naproxen (NAPROSYN) 375 MG tablet Take 1 tablet (375 mg total) by mouth 2 (two) times daily with a meal. 10/17/18   Mesner, Corene Cornea, MD    Allergies    Patient has no known allergies.  Review of Systems   Review of Systems  Constitutional: Negative for chills and fever.  Respiratory: Positive for cough and  shortness of breath.   Cardiovascular: Negative for chest pain.    Physical Exam Updated Vital Signs BP 118/78 (BP Location: Left Arm)   Pulse 72   Temp 97.7 F (36.5 C) (Oral)   Resp 19   SpO2 99%   Physical Exam Vitals and nursing note reviewed.  Constitutional:      General: He is not in acute distress.    Appearance: Normal appearance. He is well-developed. He is not ill-appearing.     Comments: Calm, cooperative. Walking around in the room  HENT:     Head: Normocephalic and atraumatic.  Eyes:     General: No scleral icterus.       Right eye: No discharge.        Left eye: No discharge.     Conjunctiva/sclera: Conjunctivae normal.     Pupils: Pupils are equal, round, and reactive to light.  Cardiovascular:     Rate and Rhythm: Normal rate and regular rhythm.  Pulmonary:     Effort: Pulmonary effort is normal. No respiratory distress.     Breath sounds: Normal breath sounds.  Abdominal:     General: There is no distension.  Musculoskeletal:     Cervical back: Normal range of motion.  Skin:    General: Skin is warm and dry.  Neurological:  Mental Status: He is alert and oriented to person, place, and time.  Psychiatric:        Behavior: Behavior normal.     ED Results / Procedures / Treatments   Labs (all labs ordered are listed, but only abnormal results are displayed) Labs Reviewed - No data to display  EKG None  Radiology No results found.  Procedures Procedures (including critical care time)  Medications Ordered in ED Medications - No data to display  ED Course  I have reviewed the triage vital signs and the nursing notes.  Pertinent labs & imaging results that were available during my care of the patient were reviewed by me and considered in my medical decision making (see chart for details).  59 year old male presents with cough for 2 weeks.  He states that he did test positive for Covid several days ago.  His vital signs are normal.  He  is overall well-appearing.  Chest x-ray was obtained negative.  Discussed results with the patient using his daughter as a Optometrist.  We will send in Tessalon and short course of prednisone.  Advised return if worsening.  MDM Rules/Calculators/A&P                       Final Clinical Impression(s) / ED Diagnoses Final diagnoses:  COVID-19  Cough    Rx / DC Orders ED Discharge Orders    None       Recardo Evangelist, PA-C 06/05/19 0840    Tegeler, Gwenyth Allegra, MD 06/05/19 1234

## 2019-06-05 NOTE — ED Triage Notes (Signed)
Patient reports cough with chest congestion onset last week , denies fever or chills .

## 2019-06-10 ENCOUNTER — Ambulatory Visit (HOSPITAL_COMMUNITY)
Admission: EM | Admit: 2019-06-10 | Discharge: 2019-06-10 | Disposition: A | Discharge status: Home / Self Care | Payer: Self-pay

## 2019-06-10 ENCOUNTER — Encounter (HOSPITAL_COMMUNITY): Payer: Self-pay

## 2019-06-10 ENCOUNTER — Other Ambulatory Visit: Payer: Self-pay

## 2019-06-10 DIAGNOSIS — Z7689 Persons encountering health services in other specified circumstances: Secondary | ICD-10-CM

## 2019-06-10 DIAGNOSIS — U071 COVID-19: Secondary | ICD-10-CM

## 2019-06-10 DIAGNOSIS — Z8616 Personal history of COVID-19: Secondary | ICD-10-CM

## 2019-06-10 DIAGNOSIS — Z0289 Encounter for other administrative examinations: Secondary | ICD-10-CM

## 2019-06-10 NOTE — Discharge Instructions (Signed)
Kyle Cooke has satisfied CDC recommendations to end isolation. Current CDC recommendations for ending isolation are:   1. At least 10 days passed since symptoms first appeared, 2. At least 24 hours has passed since last fever without use of fever-reducing medications and 3. Symptoms have improved.  Furthermore CDC recommends against covid-19 testing to establish recovery from covid-19 infection. Testing is not recommended for 90 days.

## 2019-06-10 NOTE — ED Triage Notes (Signed)
Pt wants a work note, he tested positive for covid 19 on 06/01/2019. Pt denies any symptoms at this time

## 2019-06-10 NOTE — ED Provider Notes (Signed)
[facility]    CSN: [id number] Arrival date & time: 06/10/19  1353      History   Chief Complaint Chief Complaint  Patient presents with  . Work note    HPI Kyle Cooke is a 59 y.o. male.   Patient is a 59 year old male who presents today for a note to return to work.  He tested positive for Covid on 06/01/2019.  His symptoms have since improved.  He is afebrile.     Past Medical History:  Diagnosis Date  . Anemia   . Atypical chest pain     Noncardiac in etiology with negative cardiac enzymes 2-D echo and EKG during December 2011 admission  . Colitis   . 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 Hypokalemia   . Vitamin D deficiency     Patient Active Problem List   Diagnosis Date Noted  . Chest pain 08/26/2017  . Intercostal pain   . Colitis 08/04/2014  . Abdominal pain 06/02/2014  . Microcytic anemia 05/10/2010    History reviewed. No pertinent surgical history.     Home Medications    Prior to Admission medications   Medication Sig Start Date End Date Taking? Authorizing Provider  benzonatate (TESSALON) 100 MG capsule Take 1 capsule (100 mg total) by mouth every 8 (eight) hours. 06/05/19   Recardo Evangelist, PA-C  meclizine (ANTIVERT) 25 MG tablet Take 1 tablet (25 mg total) by mouth 3 (three) times daily as needed for dizziness. 10/17/18   Mesner, Corene Cornea, MD  naproxen (NAPROSYN) 375 MG tablet Take 1 tablet (375 mg total) by mouth 2 (two) times daily with a meal. 10/17/18   Mesner, Corene Cornea, MD  predniSONE (DELTASONE) 20 MG tablet Take 2 tablets (40 mg total) by mouth daily. 06/05/19   Recardo Evangelist, PA-C    Family History History reviewed. No pertinent family history.  Social History Social History   Tobacco Use  . Smoking status: Never Smoker  . Smokeless tobacco: Never Used  Substance Use Topics  . Alcohol use: No    Alcohol/week: 0.0 standard drinks  . Drug use: No     Allergies   Patient has no known allergies.   Review of  Systems Review of Systems  Constitutional: Negative for activity change, appetite change, fatigue and fever.  HENT: Negative for congestion, postnasal drip, rhinorrhea, sinus pressure, sinus pain and sore throat.   Respiratory: Negative for cough and shortness of breath.      Physical Exam Triage Vital Signs ED Triage Vitals  Enc Vitals Group     BP 06/10/19 1447 (!) 147/82     Pulse Rate 06/10/19 1447 71     Resp 06/10/19 1447 17     Temp 06/10/19 1447 97.8 F (36.6 C)     Temp Source 06/10/19 1447 Oral     SpO2 06/10/19 1447 98 %     Weight --      Height --      Head Circumference --      Peak Flow --      Pain Score 06/10/19 1446 0     Pain Loc --      Pain Edu? --      Excl. in [place]? --    No data found.  Updated Vital Signs BP (!) 147/82 (BP Location: Right Arm)   Pulse 71   Temp 97.8 F (36.6 C) (Oral)   Resp 17   SpO2 98%   Visual Acuity Right  Eye Distance:   Left Eye Distance:   Bilateral Distance:    Right Eye Near:   Left Eye Near:    Bilateral Near:     Physical Exam Vitals and nursing note reviewed.  Constitutional:      Appearance: Normal appearance.  HENT:     Head: Normocephalic and atraumatic.     Nose: Nose normal.  Eyes:     Conjunctiva/sclera: Conjunctivae normal.  Pulmonary:     Effort: Pulmonary effort is normal.  Musculoskeletal:        General: Normal range of motion.     Cervical back: Normal range of motion.  Skin:    General: Skin is warm and dry.  Neurological:     Mental Status: He is alert.  Psychiatric:        Mood and Affect: Mood normal.      UC Treatments / Results  Labs (all labs ordered are listed, but only abnormal results are displayed) Labs Reviewed - No data to display  EKG   Radiology No results found.  Procedures Procedures (including critical care time)  Medications Ordered in UC Medications - No data to display  Initial Impression / Assessment and Plan / UC Course  I have reviewed the  triage vital signs and the nursing notes.  Pertinent labs & imaging results that were available during my care of the patient were reviewed by me and considered in my medical decision making (see chart for details).     COVID-19 return to work. Patient has met all requirements to return to work.  Work note given Final Clinical Impressions(s) / UC Diagnoses   Final diagnoses:  COVID-19  Return to work evaluation     Discharge Instructions     Blas Riches has satisfied CDC recommendations to end isolation. Current CDC recommendations for ending isolation are:   1. At least 10 days passed since symptoms first appeared, 2. At least 24 hours has passed since last fever without use of fever-reducing medications and 3. Symptoms have improved.  Furthermore CDC recommends against covid-19 testing to establish recovery from covid-19 infection. Testing is not recommended for 90 days.     ED Prescriptions    None     PDMP not reviewed this encounter.   Orvan July, NP 06/10/19 1514

## 2020-03-24 IMAGING — DX DG CHEST 1V PORT
1 series · 1 of 1 positions shown · non-contrast
Comparison: December 11, 2017

CLINICAL DATA: Cough and congestion

EXAM:
PORTABLE CHEST 1 VIEW

[chest]
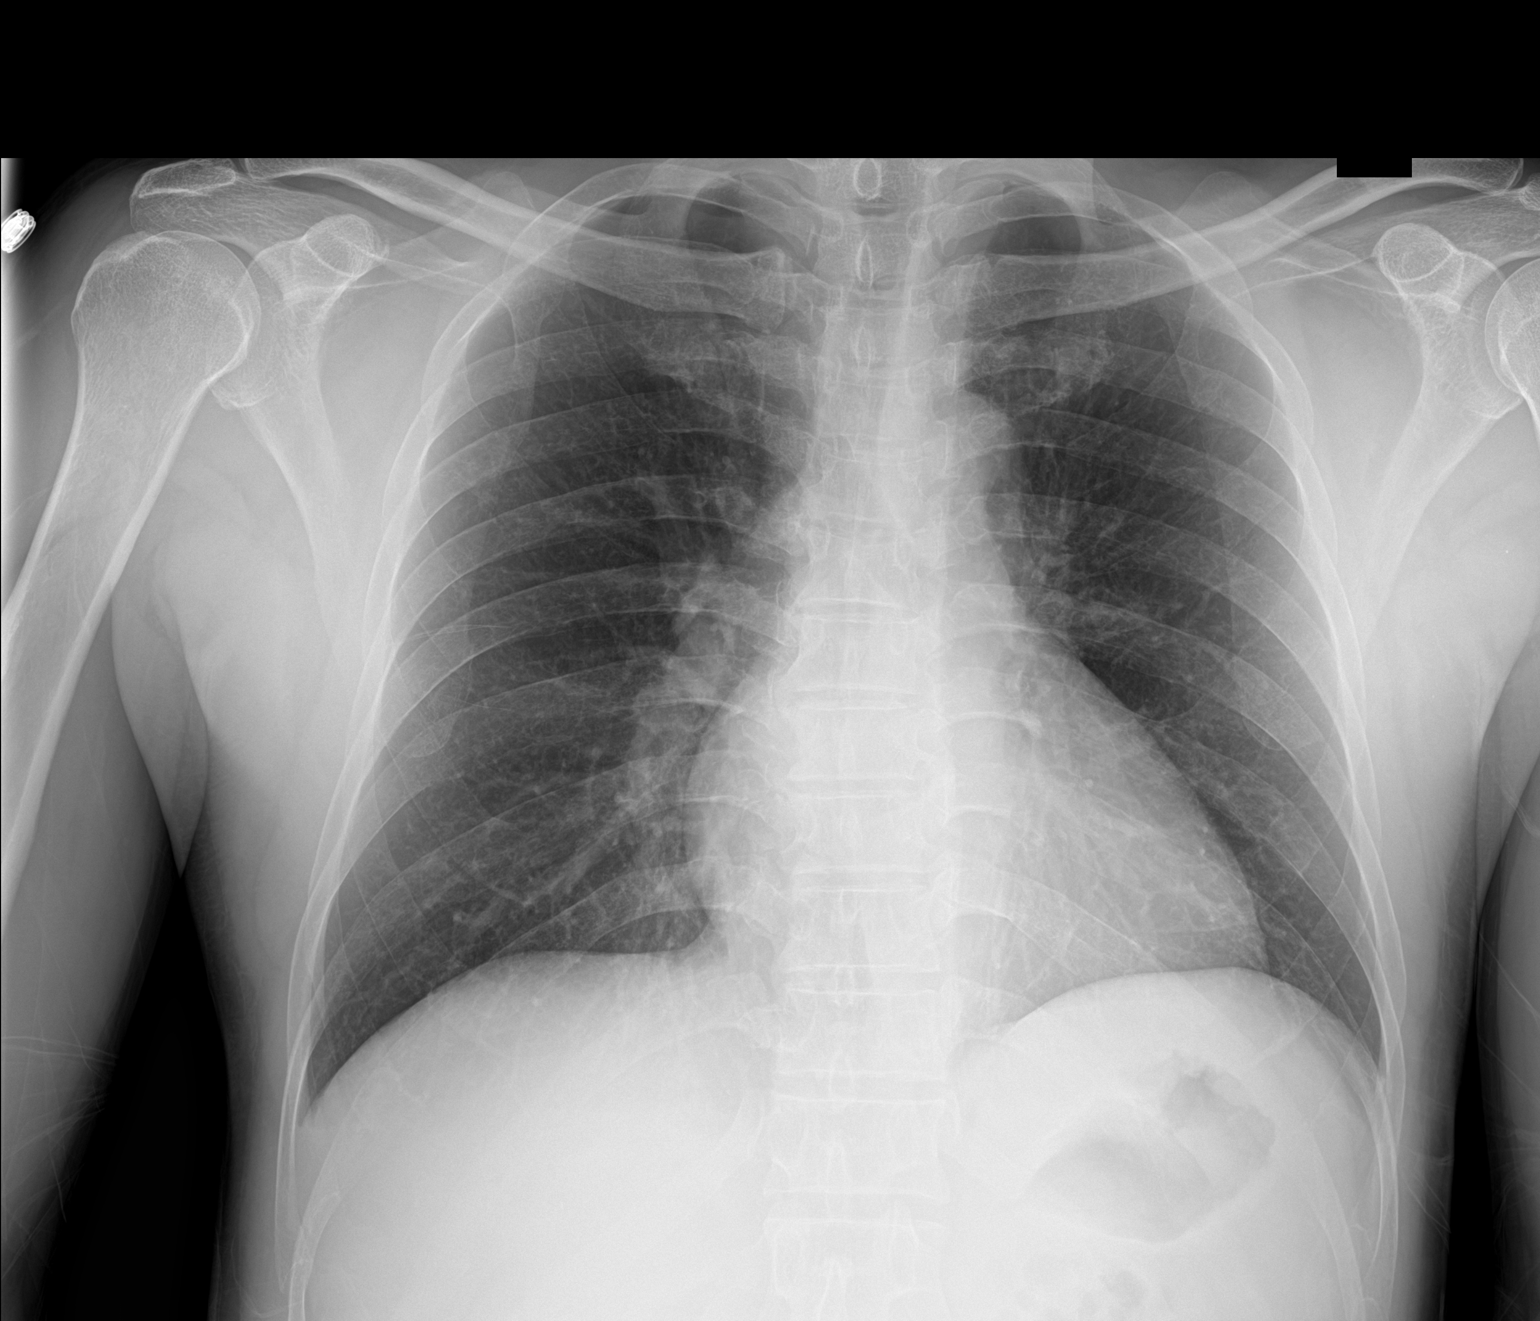

[1 of 1 positions shown; findings below may reference images not displayed]

FINDINGS: The lungs are clear. Heart size and pulmonary vascularity are
normal. No adenopathy. No bone lesions.
IMPRESSION: No edema or consolidation. Cardiac silhouette within normal limits.
No adenopathy.

## 2022-05-26 ENCOUNTER — Emergency Department (HOSPITAL_COMMUNITY)
Admission: EM | Admit: 2022-05-26 | Discharge: 2022-05-27 | Disposition: A | Discharge status: Home / Self Care | Payer: 59 | Attending: Emergency Medicine | Admitting: Emergency Medicine

## 2022-05-26 ENCOUNTER — Emergency Department (HOSPITAL_COMMUNITY): Payer: 59

## 2022-05-26 ENCOUNTER — Encounter (HOSPITAL_COMMUNITY): Payer: Self-pay

## 2022-05-26 ENCOUNTER — Other Ambulatory Visit: Payer: Self-pay

## 2022-05-26 DIAGNOSIS — R0789 Other chest pain: Secondary | ICD-10-CM | POA: Insufficient documentation

## 2022-05-26 DIAGNOSIS — R109 Unspecified abdominal pain: Secondary | ICD-10-CM | POA: Diagnosis present

## 2022-05-26 DIAGNOSIS — R1084 Generalized abdominal pain: Secondary | ICD-10-CM

## 2022-05-26 LAB — COMPREHENSIVE METABOLIC PANEL
ALT: 24 U/L (ref 0–44)
AST: 34 U/L (ref 15–41)
Albumin: 4.7 g/dL (ref 3.5–5.0)
Alkaline Phosphatase: 64 U/L (ref 38–126)
Anion gap: 12 (ref 5–15)
BUN: 16 mg/dL (ref 8–23)
CO2: 20 mmol/L — ABNORMAL LOW (ref 22–32)
Calcium: 9.4 mg/dL (ref 8.9–10.3)
Chloride: 105 mmol/L (ref 98–111)
Creatinine, Ser: 1.09 mg/dL (ref 0.61–1.24)
GFR, Estimated: >60 mL/min (ref >60)
Glucose, Bld: 126 mg/dL — ABNORMAL HIGH (ref 70–99)
Potassium: 3.7 mmol/L (ref 3.5–5.1)
Sodium: 137 mmol/L (ref 135–145)
Total Bilirubin: 3.2 mg/dL — ABNORMAL HIGH (ref 0.3–1.2)
Total Protein: 7.6 g/dL (ref 6.5–8.1)

## 2022-05-26 LAB — CBC
HCT: 39.5 % (ref 39.0–52.0)
Hemoglobin: 11.8 g/dL — ABNORMAL LOW (ref 13.0–17.0)
MCH: 22.9 pg — ABNORMAL LOW (ref 26.0–34.0)
MCHC: 29.9 g/dL — ABNORMAL LOW (ref 30.0–36.0)
MCV: 76.6 fL — ABNORMAL LOW (ref 80.0–100.0)
Platelets: 180 10*3/uL (ref 150–400)
RBC: 5.16 MIL/uL (ref 4.22–5.81)
RDW: 15.7 % — ABNORMAL HIGH (ref 11.5–15.5)
WBC: 6.2 10*3/uL (ref 4.0–10.5)
nRBC: 0 % (ref 0.0–0.2)

## 2022-05-26 LAB — TROPONIN I (HIGH SENSITIVITY)
Troponin I (High Sensitivity): <2 ng/L (ref <18)
Troponin I (High Sensitivity): 4 ng/L (ref <18)

## 2022-05-26 LAB — I-STAT CHEM 8, ED
BUN: 17 mg/dL (ref 8–23)
Calcium, Ion: 1.07 mmol/L — ABNORMAL LOW (ref 1.15–1.40)
Chloride: 107 mmol/L (ref 98–111)
Creatinine, Ser: 0.8 mg/dL (ref 0.61–1.24)
Glucose, Bld: 127 mg/dL — ABNORMAL HIGH (ref 70–99)
HCT: 40 % (ref 39.0–52.0)
Hemoglobin: 13.6 g/dL (ref 13.0–17.0)
Potassium: 3.7 mmol/L (ref 3.5–5.1)
Sodium: 140 mmol/L (ref 135–145)
TCO2: 20 mmol/L — ABNORMAL LOW (ref 22–32)

## 2022-05-26 LAB — LACTIC ACID, PLASMA
Lactic Acid, Venous: 4.1 mmol/L (ref 0.5–1.9)
Lactic Acid, Venous: 2.9 mmol/L (ref 0.5–1.9)

## 2022-05-26 LAB — MAGNESIUM: Magnesium: 2 mg/dL (ref 1.7–2.4)

## 2022-05-26 LAB — LIPASE, BLOOD: Lipase: 53 U/L — ABNORMAL HIGH (ref 11–51)

## 2022-05-26 LAB — CK: Total CK: 110 U/L (ref 49–397)

## 2022-05-26 MED ORDER — LACTATED RINGERS IV BOLUS
1000.0000 mL | Freq: Once | INTRAVENOUS | Status: AC
  Administered 2022-05-26: 1000 mL via INTRAVENOUS

## 2022-05-26 MED ORDER — IOHEXOL 350 MG/ML SOLN
75.0000 mL | Freq: Once | INTRAVENOUS | Status: AC | PRN
  Administered 2022-05-26: 75 mL via INTRAVENOUS

## 2022-05-26 MED ORDER — MORPHINE SULFATE (PF) 4 MG/ML IV SOLN
4.0000 mg | Freq: Once | INTRAVENOUS | Status: AC
  Administered 2022-05-26: 4 mg via INTRAVENOUS
  Filled 2022-05-26: qty 1

## 2022-05-26 MED ORDER — FAMOTIDINE 20 MG PO TABS: 20.0000 mg | ORAL_TABLET | Freq: Two times a day (BID) | ORAL | 0 refills | Status: DC

## 2022-05-26 MED ORDER — ONDANSETRON HCL 4 MG/2ML IJ SOLN
4.0000 mg | Freq: Once | INTRAMUSCULAR | Status: AC
  Administered 2022-05-26: 4 mg via INTRAVENOUS
  Filled 2022-05-26: qty 2

## 2022-05-26 MED ORDER — ACETAMINOPHEN 500 MG PO TABS
1000.0000 mg | ORAL_TABLET | Freq: Once | ORAL | Status: DC
  Filled 2022-05-26: qty 2

## 2022-05-26 MED ORDER — ALUM & MAG HYDROXIDE-SIMETH 200-200-20 MG/5ML PO SUSP
30.0000 mL | Freq: Once | ORAL | Status: AC
  Administered 2022-05-26: 30 mL via ORAL
  Filled 2022-05-26: qty 30

## 2022-05-26 MED ORDER — LIDOCAINE VISCOUS HCL 2 % MT SOLN
15.0000 mL | Freq: Once | OROMUCOSAL | Status: AC
  Administered 2022-05-26: 15 mL via ORAL
  Filled 2022-05-26: qty 15

## 2022-05-26 NOTE — ED Notes (Signed)
Patient transported to X-ray 

## 2022-05-26 NOTE — ED Provider Notes (Signed)
[facility] EMERGENCY DEPARTMENT Provider Note   CSN: [id number] Arrival date & time: 05/26/22  1828     History  Chief Complaint  Patient presents with   Abdominal Pain    Kyle Cooke is a 62 y.o. male with abd pain, chest pain, microcytic anemia presents with body aches, SOB, CP.   Daughter is the translator for patient.  Pt complains of acute onset severe 10/10 chest pain, shortness of breath, abdominal pain. Patients daughter states that he was in good health until 2 pm today when he had sudden onset of these symptoms. Also endorses diffuse muscle stiffness. Endorses nausea, vomiting, and constipation.  Patient states that he is not passing gas currently. Daughter states that sometimes he feels this way and takes zantac. She thinks he did not take care of himself over the holidays and ate a poor diet.  She also notes that patient had this happen in 2019 and was discharged from the ED, unsure what happened at that time.   Per chart review, this also occurred in 2019. Patient also had negative CT dissection study at that time. He received morphine, zofran, tylenol and felt much improved and was discharged.    Shortness of Breath      Home Medications Prior to Admission medications   Medication Sig Start Date End Date Taking? Authorizing Provider  famotidine (PEPCID) 20 MG tablet Take 1 tablet (20 mg total) by mouth 2 (two) times daily. 05/26/22  Yes Audley Hose, MD  benzonatate (TESSALON) 100 MG capsule Take 1 capsule (100 mg total) by mouth every 8 (eight) hours. 06/05/19   Recardo Evangelist, PA-C  meclizine (ANTIVERT) 25 MG tablet Take 1 tablet (25 mg total) by mouth 3 (three) times daily as needed for dizziness. 10/17/18   Mesner, Corene Cornea, MD  naproxen (NAPROSYN) 375 MG tablet Take 1 tablet (375 mg total) by mouth 2 (two) times daily with a meal. 10/17/18   Mesner, Corene Cornea, MD  predniSONE (DELTASONE) 20 MG tablet Take 2 tablets (40 mg total) by mouth daily. 06/05/19    Recardo Evangelist, PA-C      Allergies    Patient has no known allergies.    Review of Systems   Review of Systems  Respiratory:  Positive for shortness of breath.    Review of systems Negative for recent illnesses/fevers/chills.  A 10 point review of systems was performed and is negative unless otherwise reported in HPI.  Physical Exam Updated Vital Signs BP 132/76   Pulse 98   Temp 98.4 F (36.9 C) (Oral)   Resp 20   Ht '5\' 5"'$  (1.651 m)   Wt 59 kg   SpO2 96%   BMI 21.63 kg/m  Physical Exam General: Uncomfortable-appearing male, lying in bed.  HEENT: PERRLA, Sclera anicteric, MMM, trachea midline.  Cardiology: RRR, no murmurs/rubs/gallops. BL radial and DP pulses equal bilaterally.  Resp: Increased respiratory rate. CTAB, no wheezes, rhonchi, crackles.  Abd: Diffusely tender throughout belly. Soft, non-distended. No rebound tenderness or guarding.  GU: Deferred. MSK: No peripheral edema or signs of trauma. Extremities without deformity or TTP. No cyanosis or clubbing. Skin: warm, dry. No rashes or lesions. Back: No CVA tenderness Neuro: A&Ox4, CNs II-XII grossly intact. MAEs. Sensation grossly intact. Normal coordination.   ED Results / Procedures / Treatments   Labs (all labs ordered are listed, but only abnormal results are displayed) Labs Reviewed  CBC - Abnormal; Notable for the following components:      Result  Value   Hemoglobin 11.8 (*)    MCV 76.6 (*)    MCH 22.9 (*)    MCHC 29.9 (*)    RDW 15.7 (*)    All other components within normal limits  COMPREHENSIVE METABOLIC PANEL - Abnormal; Notable for the following components:   CO2 20 (*)    Glucose, Bld 126 (*)    Total Bilirubin 3.2 (*)    All other components within normal limits  LIPASE, BLOOD - Abnormal; Notable for the following components:   Lipase 53 (*)    All other components within normal limits  LACTIC ACID, PLASMA - Abnormal; Notable for the following components:   Lactic Acid, Venous 4.1  (*)    All other components within normal limits  LACTIC ACID, PLASMA - Abnormal; Notable for the following components:   Lactic Acid, Venous 2.9 (*)    All other components within normal limits  I-STAT CHEM 8, ED - Abnormal; Notable for the following components:   Glucose, Bld 127 (*)    Calcium, Ion 1.07 (*)    TCO2 20 (*)    All other components within normal limits  RESP PANEL BY RT-PCR (RSV, FLU A&B, COVID)  RVPGX2  MAGNESIUM  CK  TROPONIN I (HIGH SENSITIVITY)  TROPONIN I (HIGH SENSITIVITY)    EKG EKG Interpretation  Date/Time:  Monday May 26 2022 19:28:22 EST Ventricular Rate:  97 PR Interval:  158 QRS Duration: 84 QT Interval:  345 QTC Calculation: 439 R Axis:   82 Text Interpretation: Sinus rhythm Probable left atrial enlargement Borderline right axis deviation Confirmed by Cindee Lame [phone]) on 05/26/2022 7:40:45 PM  Radiology DG Chest 2 View  Result Date: 05/26/2022 CLINICAL DATA:  Chest pain with shortness of breath and abdominal pain. EXAM: CHEST - 2 VIEW COMPARISON:  June 05, 2019 FINDINGS: The heart size and mediastinal contours are within normal limits. Both lungs are clear. The visualized skeletal structures are unremarkable. IMPRESSION: No active cardiopulmonary disease. Electronically Signed   By: Virgina Norfolk M.D.   On: 05/26/2022 20:12   CT Angio Chest/Abd/Pel for Dissection W and/or Wo Contrast  Result Date: 05/26/2022 CLINICAL DATA:  Acute aortic syndrome (AAS) suspected. Abdominal pain EXAM: CT ANGIOGRAPHY CHEST, ABDOMEN AND PELVIS TECHNIQUE: Non-contrast CT of the chest was initially obtained. Multidetector CT imaging through the chest, abdomen and pelvis was performed using the standard protocol during bolus administration of intravenous contrast. Multiplanar reconstructed images and MIPs were obtained and reviewed to evaluate the vascular anatomy. RADIATION DOSE REDUCTION: This exam was performed according to the departmental dose-optimization  program which includes automated exposure control, adjustment of the mA and/or kV according to patient size and/or use of iterative reconstruction technique. CONTRAST:  16m OMNIPAQUE IOHEXOL 350 MG/ML SOLN COMPARISON:  CT abdomen pelvis 04/05/2017 FINDINGS: CTA CHEST FINDINGS Cardiovascular: Fair opacification of the thoracic aorta. No evidence of thoracic aortic aneurysm or dissection. Normal heart size. No significant pericardial effusion. No atherosclerotic plaque of the thoracic aorta. No coronary artery calcifications. The main pulmonary artery is normal in caliber. No central or proximal segmental pulmonary embolus. Limited evaluation more distally due motion artifact. Mediastinum/Nodes: No enlarged mediastinal, hilar, or axillary lymph nodes. Thyroid gland, trachea, and esophagus demonstrate no significant findings. Lungs/Pleura: No focal consolidation. No pulmonary nodule. No pulmonary mass. No pleural effusion. No pneumothorax. Musculoskeletal: No chest wall abnormality. No suspicious lytic or blastic osseous lesions. No acute displaced fracture. Multilevel mild degenerative changes of the spine. Review of the MIP images confirms the  above findings. CTA ABDOMEN AND PELVIS FINDINGS VASCULAR Aorta: Normal caliber aorta without aneurysm, dissection, vasculitis or significant stenosis. Celiac: Patent without evidence of aneurysm, dissection, vasculitis or significant stenosis. SMA: Patent without evidence of aneurysm, dissection, vasculitis or significant stenosis. Renals: Both renal arteries are patent without evidence of aneurysm, dissection, vasculitis, fibromuscular dysplasia or significant stenosis. IMA: Patent without evidence of aneurysm, dissection, vasculitis or significant stenosis. Inflow: Patent without evidence of aneurysm, dissection, vasculitis or significant stenosis. Veins: No obvious venous abnormality within the limitations of this arterial phase study. Review of the MIP images confirms the  above findings. NON-VASCULAR Hepatobiliary: No focal liver abnormality. No gallstones, gallbladder wall thickening, or pericholecystic fluid. No biliary dilatation. Pancreas: No focal lesion. Normal pancreatic contour. No surrounding inflammatory changes. No main pancreatic ductal dilatation. Spleen: Normal in size without focal abnormality. Adrenals/Urinary Tract: There is a No adrenal nodule bilaterally. Bilateral kidneys enhance symmetrically. Stable in size 1.3 cm left renal lesion with a density of 27 Hounsfield units - left Bosniak II benign renal cyst measuring 1.3 cm. No follow-up imaging is recommended. JACR 2018 Feb; 264-273, Management of the Incidental Renal Mass on CT, [employer] 2021; 814-848, Bosniak Classification of Cystic Renal Masses, Version 2019. No hydronephrosis. No hydroureter.  No nephroureterolithiasis. The urinary bladder is unremarkable. Stomach/Bowel: Stomach is within normal limits. No evidence of bowel wall thickening or dilatation. Appendix appears normal. Lymphatic: No lymphadenopathy. Reproductive: Prostate is unremarkable. Other: No intraperitoneal free fluid. No intraperitoneal free gas. No organized fluid collection. Musculoskeletal: No abdominal wall hernia or abnormality. Densely sclerotic lesion within the right femoral neck likely bone island. No suspicious lytic or blastic osseous lesions. No acute displaced fracture. Multilevel mild degenerative changes of the spine. L1 vertebral body hemangioma. Review of the MIP images confirms the above findings. IMPRESSION: 1. No acute aortic abnormality. 2. No central or proximal segmental pulmonary embolus. 3. No acute intrathoracic, intra-abdominal, intrapelvic abnormality. Electronically Signed   By: Iven Finn M.D.   On: 05/26/2022 19:48    Procedures Procedures    Medications Ordered in ED Medications  acetaminophen (TYLENOL) tablet 1,000 mg (0 mg Oral Hold 05/26/22 2119)  iohexol (OMNIPAQUE) 350 MG/ML injection 75  mL (75 mLs Intravenous Contrast Given 05/26/22 1924)  ondansetron (ZOFRAN) injection 4 mg (4 mg Intravenous Given 05/26/22 2112)  alum & mag hydroxide-simeth (MAALOX/MYLANTA) 200-200-20 MG/5ML suspension 30 mL (30 mLs Oral Given 05/26/22 2114)    And  lidocaine (XYLOCAINE) 2 % viscous mouth solution 15 mL (15 mLs Oral Given 05/26/22 2114)  morphine (PF) 4 MG/ML injection 4 mg (4 mg Intravenous Given 05/26/22 2112)  lactated ringers bolus 1,000 mL (0 mLs Intravenous Stopped 05/26/22 2235)    ED Course/ Medical Decision Making/ A&P                          Medical Decision Making Amount and/or Complexity of Data Reviewed Labs: ordered. Decision-making details documented in ED Course. Radiology:  Decision-making details documented in ED Course.  Risk OTC drugs. Prescription drug management.    This patient presents to the ED for concern of chest and abdominal pain, this involves an extensive number of treatment options, and is a complaint that carries with it a high risk of complications and morbidity.  I considered the following differential and admission for this acute, potentially life threatening condition. Patient was in acute distress in triage, on my assessment he is in less distress but still appears very uncomfortable. Despite that,  he is afebrile and HDS.   MDM:    Consider broad workup for patient's complaints.   Per triage provider, patient was writhing in pain in triage and described acute onset sudden chest and abdominal pain that began at 2 PM this afternoon.  This was concerning for dissection and CT dissection protocol was ordered and performed immediately.  Also consider other serious causes of chest and abdominal pain such as ACS/arrhythmia, PE with chest pain and shortness of breath, SBO/ileus given nausea vomiting some constipation and lack of flatus, mesenteric ischemia, volvulus, constipation. He has no signs of acute abdomen on exam, no rebound tenderness/guarding. No report of  urinary symptoms and no CVA tenderness to indicate UTI or pyelo. No neurologic symptoms, moving all extremities equally, no headache, no c/f CVA or intracranial pathology. Considered myocarditis/pericarditis, cardiac tamponade though normotensive, PTX, pneumomediasinum/mediastinitis, esophageal spasm, gastritis/PUD, musculoskeletal spasm or pain, rhabdomyolysis given report of muscle stiffness. Will obtain labs, imaging and reevaluate.   Clinical Course as of 05/26/22 2306  Mon May 26, 2022  2029 CT Angio Chest/Abd/Pel for Dissection W and/or Wo Contrast 1. No acute aortic abnormality. 2. No central or proximal segmental pulmonary embolus. 3. No acute intrathoracic, intra-abdominal, intrapelvic abnormality.   [HN]  2030 DG Chest 2 View No active cardiopulmonary disease. [HN]  2142 Lactic Acid, Venous(!!): 4.1 Will fluid resuscitate [HN]  2142 Troponin I (High Sensitivity): <2 [HN]  2222 Lactic Acid, Venous(!!): 2.9 Downtrending [HN]  2224 CK Total: 110 [HN]  2301 Total Bilirubin(!): 3.2 Mildly increased Tbili which is consistent with patient's prior values. No other increase in LFTs.  [HN]  2302 Patient was given morphine, zofran, tylenol, 1L IVF, as he had received in his same presentation in 2019. Also gave maalox for possible gastritis. Patient was then reevaluated.  His shortness of breath and symptoms have mostly improved.  He states he feels much better.  Discussed extensively with the patient, his wife, and son at bedside.  Son acts as Optometrist.  Discussed that patient has had poor p.o. intake recently.  Given his abdominal pain nausea vomiting and poor p.o. intake it is possible that there is a component of reflux or gastritis/PUD as a component of his pain.  He does not take any antacid medications and daughter earlier suggested that he takes Zantac sometimes for similar symptoms.  Will prescribe patient Pepcid 20 mg twice daily x 30 days.  Encourage patient to stay well-hydrated at  home.  Also in recommended Tylenol ibuprofen for pain.  Per chart review when patient had similar symptoms in 2019 he also had a negative workup and was ultimately recommended to follow-up with cardiology at discharge.  They never followed up and he does not have a PCP at this time.  Recommended they call [facility] to establish with a PCP and will also again refer to cardiology for atypical chest pain follow-up.  Patient is given extensive discharge instructions and return precautions, all questions answered to patient satisfaction. [HN]    Clinical Course User Index [HN] Audley Hose, MD    Labs: I Ordered, and personally interpreted labs.  The pertinent results include:  those listed above  Imaging Studies ordered: I ordered imaging studies including CT dissection, CXR I independently visualized and interpreted imaging. I agree with the radiologist interpretation  Additional history obtained from daughter and son, chart review.   Cardiac Monitoring: The patient was maintained on a cardiac monitor.  I personally viewed and interpreted the cardiac monitored which showed an underlying  rhythm of: NSR  Reevaluation: After the interventions noted above, I reevaluated the patient and found that they have :resolved  Social Determinants of Health: Patient lives independently. Originally from [place].  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    Dispense:  30 tablet    Refill:  0    I have reviewed the patients home medicines and have made adjustments as needed  Problem List / ED Course: Problem List Items Addressed This Visit       Other   Abdominal pain   Other Visit Diagnoses     Atypical chest pain    -  Primary   Relevant Orders   Ambulatory referral to Cardiology                   This note was created using dictation software, which may contain spelling or grammatical errors.    Audley Hose, MD 05/26/22 [phone]

## 2022-05-26 NOTE — ED Triage Notes (Signed)
Pt bib family with reports of sudden onset generalized body aches and shob. Pt rocking back and forth in triage, unable to hold still. Pt appears uncomfortable.

## 2022-05-26 NOTE — Discharge Instructions (Signed)
Thank you for coming to [facility] Emergency Department. You were seen for chest and abdominal pain, shortness of breath. We did an exam, labs, and imaging, and these showed no acute findings. You improved after nausea medicine, pain medicine, and medicine for gastritis, or irritation of the stomach caused by acid. It is possible gastritis is contributing to your symptoms. Please take Pepcid 20 mg by mouth twice per day for 30 days. We have also referred you to cardiology who will call you to schedule an appointment. Please stay well hydrated as well and drink plenty of water. You can alternate taking Tylenol and ibuprofen as needed for pain. You can take '650mg'$  tylenol (acetaminophen) every 4-6 hours, and 600 mg ibuprofen 3 times a day.   Please follow up with your primary care provider within 2 weeks. You can call [facility] at [phone] to establish with a doctor.  Do not hesitate to return to the ED or call 911 if you experience: -Worsening symptoms -Nausea vomiting so severe that you cannot eat or drink -Lightheadedness, passing out -Fevers/chills -Anything else that concerns you

## 2022-05-26 NOTE — ED Provider Triage Note (Signed)
Emergency Medicine Provider Triage Evaluation Note  Kyle Cooke , a 62 y.o. male  was evaluated in triage.  Pt complains of chest pain, shortness of breath, abdominal pain. Patients daughter states that he was in good health until 2 pm today when he had sudden onset chest pain, shortness of breath, and abdominal pain. Also endorses diffuse muscle stiffness. Denies nausea, vomiting, diarrhea.  Review of Systems  Positive:  Negative:   Physical Exam  BP (!) 125/110 (BP Location: Right Arm)   Pulse (!) 103   Resp 20   Ht '5\' 5"'$  (1.651 m)   Wt 59 kg   SpO2 100%   BMI 21.63 kg/m  Gen:   Awake, no distress  patient in obvious discomfort Resp:  Normal effort  MSK:   Moves extremities without difficulty  Other:  Unable to reproduce pain  Medical Decision Making  Medically screening exam initiated at 6:57 PM.  Appropriate orders placed.  Kyle Cooke was informed that the remainder of the evaluation will be completed by another provider, this initial triage assessment does not replace that evaluation, and the importance of remaining in the ED until their evaluation is complete.     Kyle Cooke 05/26/22 1902

## 2022-06-24 NOTE — Progress Notes (Unsigned)
Cardiology Office Note:    Date:  06/26/2022   ID:  [name], [name] [birth year], MRN [id number]  PCP:  Patient, No Pcp Per   [facility] Providers Cardiologist:   Kyle Cooke   Referring MD: Kyle Hose, MD   Chief Complaint  Patient presents with   Chest Pain    History of Present Illness:    Kyle Cooke is a 62 y.o. male with a hx of atypical chest pain .   Seen with daughter, Kyle Cooke   Had CP on new years  Has been having cp since 2019   CP actually starts as a stomach pain  Pain might last for all day long  Has vomitting if he tries to eat during an episode  No associated   diarrhea,   In the ER , he was found to have volume depletion .  No exercise   CTA of the aorta - no dissection   ER visit showed anemia,  low MCV . Has had anemia in the past. 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 No pertinent surgical history.  Current Medications: No outpatient medications have been marked as taking for the 06/25/22 encounter (Office Visit) with Kyle Cooke, Kyle Cheng, MD.     Allergies:   Patient has no known allergies.   Social History   Socioeconomic History   Marital status: Married    Spouse name: Not on file   Number of children: Not on file   Years of education: Not on file   Highest education level: Not on file  Occupational History   Occupation: employed  Tobacco Use   Smoking status: Never   Smokeless tobacco: Never  Vaping Use   Vaping Use: Never used  Substance and Sexual Activity   Alcohol use: No    Alcohol/week: 0.0 standard  drinks of alcohol   Drug use: No   Sexual activity: Not on file  Other Topics Concern   Not on file  Social History Narrative   Pt. is from [place]; speaks some [place].    Currently employed: works at [employer], [employer].     Hobbies: volunteers within the church.    Denies smoking, alcohol or illicit drug use.Smoking Status:  never   Social Determinants of Radio broadcast assistant Strain: Not on file  Food Insecurity: Not on file  Transportation Needs: Not on file  Physical Activity: Not on file  Stress: Not on file  Social Connections: Not on file     Family History: The patient's family history is not on file.  ROS:   Please see the history of present illness.     All other systems reviewed and are negative.  EKGs/Labs/Other Studies Reviewed:    The following studies were reviewed today:   EKG:   05/26/22 :  NSR at 97. No ST orT wave changes.   Normal ecg   Recent Labs: 05/26/2022: Magnesium 2.0 06/25/2022:  ALT 22; BUN 17; Creatinine, Ser 0.78; Hemoglobin 12.6; Platelets 219; Potassium 4.1; Sodium 142; TSH 1.370  Recent Lipid Panel    Component Value Date/Time   CHOL 120 06/25/2022 0931   TRIG 164 (H) 06/25/2022 0931   HDL 37 (L) 06/25/2022 0931   CHOLHDL 3.2 06/25/2022 0931   CHOLHDL 4.2 05/02/2010 0449   VLDL 23 05/02/2010 0449   LDLCALC 55 06/25/2022 0931     Risk Assessment/Calculations:                Physical Exam:    VS:  BP 138/84   Pulse 65   Ht 5' (1.524 m)   Wt 122 lb (55.3 kg)   SpO2 98%   BMI 23.83 kg/m     Wt Readings from Last 3 Encounters:  06/25/22 122 lb (55.3 kg)  05/26/22 130 lb (59 kg)  12/11/17 130 lb (59 kg)     GEN:  Well nourished, well developed in no acute distress HEENT: Normal NECK: No JVD; No carotid bruits LYMPHATICS: No lymphadenopathy CARDIAC: RR , soft outflow murmur  RESPIRATORY:  Clear to auscultation without rales, wheezing or rhonchi  ABDOMEN: Soft, non-tender, non-distended MUSCULOSKELETAL:   No edema; No deformity  SKIN: Warm and dry NEUROLOGIC:  Alert and oriented x 3 PSYCHIATRIC:  Normal affect   ASSESSMENT:    1. Abdominal pain, unspecified abdominal location   2. Chest pain of uncertain etiology   3. Anemia, unspecified type    PLAN:    In order of problems listed above:  Chest pain: I actually think that most of his chest pain is actually abdominal pain.  He continued to point to his abdomen.  He states that it starts in his abdomen and radiates up into his chest.  We will do a Lexiscan Myoview study to make sure that he does not have coronary artery disease.  The daughter does not know anything about family history.  He does not know his cholesterol levels although they were normal 12 years ago.   2.  Abdominal pain: He has intense abdominal pain.  He occasionally has blood in his stool or black tarry stools.  He he has a history of anemia.  He was anemic when he was seen in the emergency room on January 1.  I suspect he has a GI etiology to this discomfort.  Will refer him to gastroenterology.  He is never had endoscopy or colonoscopy.      Shared Decision Making/Informed Consent The risks [chest pain, shortness of breath, cardiac arrhythmias, dizziness, blood pressure fluctuations, myocardial infarction, stroke/transient ischemic attack, nausea, vomiting, allergic reaction, radiation exposure, metallic taste sensation and life-threatening complications (estimated to be 1 in 10,000)], benefits (risk stratification, diagnosing coronary artery disease, treatment guidance) and alternatives of a nuclear stress test were discussed in detail with Mr. Kyle Cooke and he agrees to proceed.    Medication Adjustments/Labs and Tests Ordered: Current medicines are reviewed at length with the patient today.  Concerns regarding medicines are outlined above.  Orders Placed This Encounter  Procedures   Lipid panel   Basic metabolic panel   CBC   Hepatic function panel   TSH    Ambulatory referral to Gastroenterology   Cardiac Stress Test: Informed Consent Details: Physician/Practitioner Attestation; Transcribe to consent form and obtain patient signature   MYOCARDIAL PERFUSION IMAGING   No orders of the defined types were placed in this encounter.     Patient Instructions  Medication Instructions:  Your physician recommends that  you continue on your current medications as directed. Please refer to the Current Medication list given to you today.  *If you need a refill on your cardiac medications before your next appointment, please call your pharmacy*  Lab Work: TODAY: Lipid panel, BMP, CBC, LFTs, TSH If you have labs (blood work) drawn today and your tests are completely normal, you will receive your results only by: [place] (if you have MyChart) OR A paper copy in the mail If you have any lab test that is abnormal or we need to change your treatment, we will call you to review the results.   Testing/Procedures: Your physician has requested that you have a lexiscan myoview. For further information please visit [email or web address]. Please follow instruction sheet, as given.   Follow-Up: At [facility], you and your health needs are our priority.  As part of our continuing mission to provide you with exceptional heart care, we have created designated Provider Care Teams.  These Care Teams include your primary Cardiologist (physician) and Advanced Practice Providers (APPs -  Physician Assistants and Nurse Practitioners) who all work together to provide you with the care you need, when you need it.  Your next appointment:   As needed  Provider:   Mertie Moores, MD   Other Instructions You have been referred to [facility] Gastroenterology. A scheduler will call you to schedule your appointment. Address: [address], [place], [place] [address] Phone: [phone]  Stress Test Instructions  Please arrive 15 minutes  prior to your appointment time for registration and insurance purposes.  The test will take approximately 3 to 4 hours to complete; you may bring reading material.  If someone comes with you to your appointment, they will need to remain in the main lobby due to limited space in the testing area. **If you are pregnant or breastfeeding, please notify the nuclear lab prior to your appointment**  How to prepare for your Myocardial Perfusion Test: Do not eat or drink 3 hours prior to your test, except you may have water. Do not consume products containing caffeine (regular or decaffeinated) 12 hours prior to your test. (ex: coffee, chocolate, sodas, tea). Do bring a list of your current medications with you.  If not listed below, you may take your medications as normal. Do wear comfortable clothes (no dresses or overalls) and walking shoes, tennis shoes preferred (No heels or open toe shoes are allowed). Do NOT wear cologne, perfume, aftershave, or lotions (deodorant is allowed). If these instructions are not followed, your test will have to be rescheduled.  Please report to [address] for your test.  If you have questions or concerns about your appointment, you can call the Nuclear Lab at [phone].  If you cannot keep your appointment, please provide 24 hours notification to the Nuclear Lab, to avoid a possible $50 charge to your account.   Signed, Mertie Moores, MD  06/26/2022 9:06 PM    [place]

## 2022-06-25 ENCOUNTER — Ambulatory Visit: Payer: 59 | Attending: Cardiovascular Disease | Admitting: Cardiovascular Disease

## 2022-06-25 ENCOUNTER — Encounter: Payer: Self-pay | Admitting: Gastroenterology

## 2022-06-25 ENCOUNTER — Encounter: Payer: Self-pay | Admitting: Cardiovascular Disease

## 2022-06-25 VITALS — BP 138/84 | HR 65 | Ht 60.0 in | Wt 122.0 lb

## 2022-06-25 DIAGNOSIS — R079 Chest pain, unspecified: Secondary | ICD-10-CM | POA: Diagnosis not present

## 2022-06-25 DIAGNOSIS — R109 Unspecified abdominal pain: Secondary | ICD-10-CM

## 2022-06-25 DIAGNOSIS — D649 Anemia, unspecified: Secondary | ICD-10-CM | POA: Diagnosis not present

## 2022-06-25 NOTE — Patient Instructions (Addendum)
Medication Instructions:  Your physician recommends that you continue on your current medications as directed. Please refer to the Current Medication list given to you today.  *If you need a refill on your cardiac medications before your next appointment, please call your pharmacy*  Lab Work: TODAY: Lipid panel, BMP, CBC, LFTs, TSH If you have labs (blood work) drawn today and your tests are completely normal, you will receive your results only by: [place] (if you have MyChart) OR A paper copy in the mail If you have any lab test that is abnormal or we need to change your treatment, we will call you to review the results.   Testing/Procedures: Your physician has requested that you have a lexiscan myoview. For further information please visit [email or web address]. Please follow instruction sheet, as given.   Follow-Up: At [facility], you and your health needs are our priority.  As part of our continuing mission to provide you with exceptional heart care, we have created designated Provider Care Teams.  These Care Teams include your primary Cardiologist (physician) and Advanced Practice Providers (APPs -  Physician Assistants and Nurse Practitioners) who all work together to provide you with the care you need, when you need it.  Your next appointment:   As needed  Provider:   Mertie Moores, MD   Other Instructions You have been referred to [facility] Gastroenterology. A scheduler will call you to schedule your appointment. Address: [address], [place], [place] [address] Phone: [phone]  Stress Test Instructions  Please arrive 15 minutes prior to your appointment time for registration and insurance purposes.  The test will take approximately 3 to 4 hours to complete; you may bring reading material.  If someone comes with you to your appointment, they will need to remain in the main lobby due to limited space in the testing area. **If you are  pregnant or breastfeeding, please notify the nuclear lab prior to your appointment**  How to prepare for your Myocardial Perfusion Test: Do not eat or drink 3 hours prior to your test, except you may have water. Do not consume products containing caffeine (regular or decaffeinated) 12 hours prior to your test. (ex: coffee, chocolate, sodas, tea). Do bring a list of your current medications with you.  If not listed below, you may take your medications as normal. Do wear comfortable clothes (no dresses or overalls) and walking shoes, tennis shoes preferred (No heels or open toe shoes are allowed). Do NOT wear cologne, perfume, aftershave, or lotions (deodorant is allowed). If these instructions are not followed, your test will have to be rescheduled.  Please report to [address] for your test.  If you have questions or concerns about your appointment, you can call the Nuclear Lab at [phone].  If you cannot keep your appointment, please provide 24 hours notification to the Nuclear Lab, to avoid a possible $50 charge to your account.

## 2022-06-26 ENCOUNTER — Encounter: Payer: Self-pay | Admitting: Cardiovascular Disease

## 2022-06-26 LAB — BASIC METABOLIC PANEL
BUN/Creatinine Ratio: 22 (ref 10–24)
BUN: 17 mg/dL (ref 8–27)
CO2: 22 mmol/L (ref 20–29)
Calcium: 9.5 mg/dL (ref 8.6–10.2)
Chloride: 103 mmol/L (ref 96–106)
Creatinine, Ser: 0.78 mg/dL (ref 0.76–1.27)
Glucose: 62 mg/dL — ABNORMAL LOW (ref 70–99)
Potassium: 4.1 mmol/L (ref 3.5–5.2)
Sodium: 142 mmol/L (ref 134–144)
eGFR: 100 mL/min/{1.73_m2} (ref >59)

## 2022-06-26 LAB — CBC
Hematocrit: 43.2 % (ref 37.5–51.0)
Hemoglobin: 12.6 g/dL — ABNORMAL LOW (ref 13.0–17.7)
MCH: 23.6 pg — ABNORMAL LOW (ref 26.6–33.0)
MCHC: 29.2 g/dL — ABNORMAL LOW (ref 31.5–35.7)
MCV: 81 fL (ref 79–97)
Platelets: 219 10*3/uL (ref 150–450)
RBC: 5.34 x10E6/uL (ref 4.14–5.80)
RDW: 17.5 % — ABNORMAL HIGH (ref 11.6–15.4)
WBC: 4.5 10*3/uL (ref 3.4–10.8)

## 2022-06-26 LAB — HEPATIC FUNCTION PANEL
ALT: 22 IU/L (ref 0–44)
AST: 26 IU/L (ref 0–40)
Albumin: 4.7 g/dL (ref 3.9–4.9)
Alkaline Phosphatase: 89 IU/L (ref 44–121)
Bilirubin Total: 1.9 mg/dL — ABNORMAL HIGH (ref 0.0–1.2)
Bilirubin, Direct: 0.41 mg/dL — ABNORMAL HIGH (ref 0.00–0.40)
Total Protein: 6.9 g/dL (ref 6.0–8.5)

## 2022-06-26 LAB — TSH: TSH: 1.37 u[IU]/mL (ref 0.450–4.500)

## 2022-06-26 LAB — LIPID PANEL
Chol/HDL Ratio: 3.2 ratio (ref 0.0–5.0)
Cholesterol, Total: 120 mg/dL (ref 100–199)
HDL: 37 mg/dL — ABNORMAL LOW (ref >39)
LDL Chol Calc (NIH): 55 mg/dL (ref 0–99)
Triglycerides: 164 mg/dL — ABNORMAL HIGH (ref 0–149)
VLDL Cholesterol Cal: 28 mg/dL (ref 5–40)

## 2022-06-27 ENCOUNTER — Telehealth (HOSPITAL_COMMUNITY): Payer: Self-pay | Admitting: *Deleted

## 2022-06-27 NOTE — Telephone Encounter (Signed)
Patient's daughter (per DPR)  given detailed instructions per Myocardial Perfusion Study Information Sheet for the test on 06/30/2022 at 7:45. Patient notified to arrive 15 minutes early and that it is imperative to arrive on time for appointment to keep from having the test rescheduled.  If you need to cancel or reschedule your appointment, please call the office within 24 hours of your appointment. . Patient's daughter verbalized understanding.Kyle Cooke

## 2022-06-30 ENCOUNTER — Ambulatory Visit (HOSPITAL_COMMUNITY): Payer: 59 | Attending: Cardiovascular Disease

## 2022-06-30 DIAGNOSIS — R079 Chest pain, unspecified: Secondary | ICD-10-CM

## 2022-06-30 LAB — MYOCARDIAL PERFUSION IMAGING
Base ST Depression (mm): 0 mm
LV dias vol: 67 mL (ref 62–150)
LV sys vol: 23 mL
Nuc Stress EF: 66 %
Peak HR: 99 {beats}/min
Rest HR: 61 {beats}/min
Rest Nuclear Isotope Dose: 10.1 mCi
SDS: 0
SRS: 0
SSS: 0
ST Depression (mm): 0 mm
Stress Nuclear Isotope Dose: 31 mCi
TID: 0.99

## 2022-06-30 MED ORDER — AMINOPHYLLINE 25 MG/ML IV SOLN
75.0000 mg | Freq: Once | INTRAVENOUS | Status: AC
  Administered 2022-06-30: 75 mg via INTRAVENOUS

## 2022-06-30 MED ORDER — REGADENOSON 0.4 MG/5ML IV SOLN
0.4000 mg | Freq: Once | INTRAVENOUS | Status: AC
  Administered 2022-06-30: 0.4 mg via INTRAVENOUS

## 2022-06-30 MED ORDER — TECHNETIUM TC 99M TETROFOSMIN IV KIT
10.1000 | PACK | Freq: Once | INTRAVENOUS | Status: AC | PRN
  Administered 2022-06-30: 10.1 via INTRAVENOUS

## 2022-06-30 MED ORDER — TECHNETIUM TC 99M TETROFOSMIN IV KIT
31.0000 | PACK | Freq: Once | INTRAVENOUS | Status: AC | PRN
  Administered 2022-06-30: 31 via INTRAVENOUS

## 2022-07-08 ENCOUNTER — Telehealth: Payer: Self-pay | Admitting: Cardiovascular Disease

## 2022-07-08 NOTE — Telephone Encounter (Signed)
Patient's daughter is asking that the nurse gives her a call. Please advise d\

## 2022-07-09 NOTE — Telephone Encounter (Signed)
Returned call to daughter, no answer, left voicemail asking that she call back if she still needs [place].

## 2022-07-15 NOTE — Telephone Encounter (Signed)
Returned call to daughter Kyle Cooke again today and was able to speak with her. She states her father would like copies of labs and stress test mailed to him to that he can take this with him to visit with new PCP to get needed things done and referral to GI. Results printed and mailed to primary address on file.

## 2022-07-25 NOTE — Progress Notes (Incomplete)
[facility] Consult Note:  History: Kyle Cooke 07/28/2022  Referring provider: Patient, No Pcp Per  Reason for consult/chief complaint: No chief complaint on file.   Subjective  HPI: Patient has a history of generalized abdominal pain, microcytic anemia, and chest pain. He had several ED visits in the 10 years. The most recent ED visit being on 05/26/2022. Per that visit, he experienced severe SOB, chest pain, and abdominal pain accompanied by nausea, vomiting, and constipation. He was prescribed zofran. He also had a recent visit with his cardiologist on 06/25/2022. Per that visit note, he experiences chest pain that begins as stomach pain accompanied with vomiting when eating any food.    Patient presents to clinic today for an evaluation of abdominal pain at the request of Dr. Mertie Moores.   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   Family History: No family history on file.  Social History: Social History   Socioeconomic History   Marital status: Married    Spouse name: Not on file   Number of children: Not on file   Years of education: Not on file   Highest education level: Not on file  Occupational History   Occupation: employed  Tobacco Use   Smoking status: Never   Smokeless tobacco: Never  Vaping Use   Vaping Use: Never used  Substance and Sexual Activity   Alcohol use: No    Alcohol/week: 0.0 standard drinks of alcohol   Drug use: No   Sexual activity: Not on file  Other Topics Concern   Not on file  Social History Narrative   Pt. is from [place]; speaks some  [place].    Currently employed: works at [employer], [employer].     Hobbies: volunteers within the church.    Denies smoking, alcohol or illicit drug use.Smoking Status:  never   Social Determinants of Radio broadcast assistant Strain: Not on file  Food Insecurity: Not on file  Transportation Needs: Not on file  Physical Activity: Not on file  Stress: Not on file  Social Connections: Not on file    Allergies: No Known Allergies  Outpatient Meds: No current outpatient medications on file.   No current facility-administered medications for this visit.      ___________________________________________________________________ Objective   Exam:  There were no vitals taken for this visit. Wt Readings from Last 3 Encounters:  06/30/22 122 lb (55.3 kg)  06/25/22 122 lb (55.3 kg)  05/26/22 130 lb (59 kg)    General: well-appearing ***  Eyes: sclera anicteric, no redness ENT: oral mucosa moist without lesions, no cervical or supraclavicular lymphadenopathy CV: ***, no JVD, no peripheral edema Resp: clear to auscultation bilaterally, normal RR and effort noted GI: soft, *** tenderness, with active bowel sounds. No guarding or palpable organomegaly noted. Skin; warm and dry, no rash or jaundice noted Neuro: awake, alert and oriented x 3. Normal gross motor function and fluent speech  Labs:      Latest Ref Rng & Units 06/25/2022  9:31 AM 05/26/2022    7:07 PM 05/26/2022    7:04 PM  CBC  WBC 3.4 - 10.8 x10E3/uL 4.5   6.2   Hemoglobin 13.0 - 17.7 g/dL 12.6  13.6  11.8   Hematocrit 37.5 - 51.0 % 43.2  40.0  39.5   Platelets 150 - 450 x10E3/uL 219   180        Latest Ref Rng & Units 06/25/2022    9:31 AM 05/26/2022    7:07 PM 05/26/2022    7:04 PM  CMP  Glucose 70 - 99 mg/dL 62  127  126   BUN 8 - 27 mg/dL '17  17  16   '$ Creatinine 0.76 - 1.27 mg/dL 0.78  0.80  1.09   Sodium 134 - 144 mmol/L 142  140  137   Potassium 3.5 - 5.2 mmol/L 4.1  3.7  3.7   Chloride 96 - 106  mmol/L 103  107  105   CO2 20 - 29 mmol/L 22   20   Calcium 8.6 - 10.2 mg/dL 9.5   9.4   Total Protein 6.0 - 8.5 g/dL 6.9   7.6   Total Bilirubin 0.0 - 1.2 mg/dL 1.9   3.2   Alkaline Phos 44 - 121 IU/L 89   64   AST 0 - 40 IU/L 26   34   ALT 0 - 44 IU/L 22   24        Latest Ref Rng & Units 06/25/2022    9:31 AM 05/26/2022    7:04 PM 12/11/2017   11:16 AM  Hepatic Function  Total Protein 6.0 - 8.5 g/dL 6.9  7.6  7.2   Albumin 3.9 - 4.9 g/dL 4.7  4.7  4.3   AST 0 - 40 IU/L 26  34  36   ALT 0 - 44 IU/L '22  24  25   '$ Alk Phosphatase 44 - 121 IU/L 89  64  70   Total Bilirubin 0.0 - 1.2 mg/dL 1.9  3.2  1.7   Bilirubin, Direct 0.00 - 0.40 mg/dL 0.41   0.3     Lipid Panel     Component Value Date/Time   CHOL 120 06/25/2022 0931   TRIG 164 (H) 06/25/2022 0931   HDL 37 (L) 06/25/2022 0931   CHOLHDL 3.2 06/25/2022 0931   CHOLHDL 4.2 05/02/2010 0449   VLDL 23 05/02/2010 0449   LDLCALC 55 06/25/2022 0931   LABVLDL 28 06/25/2022 0931     Radiologic Studies:  DG chest 05/26/2022 EXAM: CHEST - 2 VIEW   COMPARISON:  June 05, 2019   FINDINGS: The heart size and mediastinal contours are within normal limits. Both lungs are clear. The visualized skeletal structures are unremarkable.   IMPRESSION: No active cardiopulmonary disease.     CT ANGIOGRAPHY CHEST, ABDOMEN AND PELVIS 05/26/2022    TECHNIQUE: Non-contrast CT of the chest was initially obtained.   Multidetector CT imaging through the chest, abdomen and pelvis was performed using the standard protocol during bolus administration of intravenous contrast. Multiplanar reconstructed images and MIPs were obtained and reviewed to evaluate the vascular anatomy.   RADIATION DOSE REDUCTION: This exam was performed according to the departmental dose-optimization program which includes automated exposure control, adjustment of the mA and/or kV according to patient size and/or use of iterative reconstruction technique.    CONTRAST:  13m OMNIPAQUE IOHEXOL 350 MG/ML SOLN   COMPARISON:  CT abdomen pelvis 04/05/2017   FINDINGS: CTA CHEST FINDINGS  Cardiovascular: Fair opacification of the thoracic aorta. No evidence of thoracic aortic aneurysm or dissection. Normal heart size. No significant pericardial effusion. No atherosclerotic plaque of the thoracic aorta. No coronary artery calcifications. The main pulmonary artery is normal in caliber. No central or proximal segmental pulmonary embolus. Limited evaluation more distally due motion artifact.   Mediastinum/Nodes: No enlarged mediastinal, hilar, or axillary lymph nodes. Thyroid gland, trachea, and esophagus demonstrate no significant findings.   Lungs/Pleura: No focal consolidation. No pulmonary nodule. No pulmonary mass. No pleural effusion. No pneumothorax.   Musculoskeletal:   No chest wall abnormality.   No suspicious lytic or blastic osseous lesions. No acute displaced fracture. Multilevel mild degenerative changes of the spine.   Review of the MIP images confirms the above findings.   CTA ABDOMEN AND PELVIS FINDINGS   VASCULAR   Aorta: Normal caliber aorta without aneurysm, dissection, vasculitis or significant stenosis.   Celiac: Patent without evidence of aneurysm, dissection, vasculitis or significant stenosis.   SMA: Patent without evidence of aneurysm, dissection, vasculitis or significant stenosis.   Renals: Both renal arteries are patent without evidence of aneurysm, dissection, vasculitis, fibromuscular dysplasia or significant stenosis.   IMA: Patent without evidence of aneurysm, dissection, vasculitis or significant stenosis.   Inflow: Patent without evidence of aneurysm, dissection, vasculitis or significant stenosis.   Veins: No obvious venous abnormality within the limitations of this arterial phase study.   Review of the MIP images confirms the above findings.   NON-VASCULAR   Hepatobiliary: No focal  liver abnormality. No gallstones, gallbladder wall thickening, or pericholecystic fluid. No biliary dilatation.   Pancreas: No focal lesion. Normal pancreatic contour. No surrounding inflammatory changes. No main pancreatic ductal dilatation.   Spleen: Normal in size without focal abnormality.   Adrenals/Urinary Tract: There is a   No adrenal nodule bilaterally.   Bilateral kidneys enhance symmetrically.   Stable in size 1.3 cm left renal lesion with a density of 27 Hounsfield units - left Bosniak II benign renal cyst measuring 1.3 cm.   No follow-up imaging is recommended. JACR 2018 Feb; 264-273, Management of the Incidental Renal Mass on CT, [employer] 2021; 814-848, Bosniak Classification of Cystic Renal Masses, Version 2019.   No hydronephrosis. No hydroureter.  No nephroureterolithiasis.   The urinary bladder is unremarkable.   Stomach/Bowel: Stomach is within normal limits. No evidence of bowel wall thickening or dilatation. Appendix appears normal.   Lymphatic: No lymphadenopathy.   Reproductive: Prostate is unremarkable.   Other: No intraperitoneal free fluid. No intraperitoneal free gas. No organized fluid collection.   Musculoskeletal:   No abdominal wall hernia or abnormality.   Densely sclerotic lesion within the right femoral neck likely bone island. No suspicious lytic or blastic osseous lesions. No acute displaced fracture. Multilevel mild degenerative changes of the spine. L1 vertebral body hemangioma.   Review of the MIP images confirms the above findings.   IMPRESSION: 1. No acute aortic abnormality. 2. No central or proximal segmental pulmonary embolus. 3. No acute intrathoracic, intra-abdominal, intrapelvic abnormality.  Assessment: No diagnosis found.  ***   Plan: ***   Thank you for the courtesy of this consult.  Please call me with any questions or concerns.  Rutherford L[place]ick  CC: Referring provider noted above   [place] as a scribe for [place], MD.,have documented all relevant documentation on the behalf of Doran Stabler, MD,as directed by  Doran Stabler, MD while in the presence of Mallie Mussel  Evern Bio, MD.

## 2022-07-28 ENCOUNTER — Ambulatory Visit: Payer: 59 | Admitting: Gastroenterology

## 2024-06-06 ENCOUNTER — Other Ambulatory Visit: Payer: Self-pay

## 2024-06-06 ENCOUNTER — Encounter: Payer: Self-pay | Admitting: Adult Health

## 2024-06-06 ENCOUNTER — Ambulatory Visit: Admitting: Adult Health

## 2024-06-06 VITALS — BP 134/78 | HR 67 | Temp 97.5°F | Ht 61.02 in | Wt 128.6 lb

## 2024-06-06 DIAGNOSIS — Z0000 Encounter for general adult medical examination without abnormal findings: Secondary | ICD-10-CM | POA: Diagnosis not present

## 2024-06-06 DIAGNOSIS — Z7689 Persons encountering health services in other specified circumstances: Secondary | ICD-10-CM

## 2024-06-06 DIAGNOSIS — M542 Cervicalgia: Secondary | ICD-10-CM | POA: Diagnosis not present

## 2024-06-06 DIAGNOSIS — Z125 Encounter for screening for malignant neoplasm of prostate: Secondary | ICD-10-CM

## 2024-06-06 DIAGNOSIS — Z1211 Encounter for screening for malignant neoplasm of colon: Secondary | ICD-10-CM

## 2024-06-06 DIAGNOSIS — Z113 Encounter for screening for infections with a predominantly sexual mode of transmission: Secondary | ICD-10-CM | POA: Diagnosis not present

## 2024-06-06 DIAGNOSIS — Z23 Encounter for immunization: Secondary | ICD-10-CM | POA: Diagnosis not present

## 2024-06-06 DIAGNOSIS — E785 Hyperlipidemia, unspecified: Secondary | ICD-10-CM

## 2024-06-06 DIAGNOSIS — Z1212 Encounter for screening for malignant neoplasm of rectum: Secondary | ICD-10-CM

## 2024-06-06 MED ORDER — ACETAMINOPHEN 500 MG PO TABS: 1000.0000 mg | ORAL_TABLET | Freq: Three times a day (TID) | ORAL | Status: AC | PRN

## 2024-06-06 NOTE — Progress Notes (Addendum)
 "  [facility]  Provider:  Jereld Serum DNP  Code Status:  Full Code  Goals of Care:     05/26/2022    6:52 PM  Advanced Directives  Does Patient Have a Medical Advance Directive? No  Would patient like information on creating a medical advance directive? No - Patient declined     Chief Complaint  Patient presents with   Establish Care   Discussed the use of AI scribe software for clinical note transcription with the patient, who gave verbal consent to proceed.  HPI: Patient is a 64 y.o. male seen today to establish care with [facility]. He is accompanied by his daughter, who is the middle child of his three living children.  He has not reported any specific complaints or symptoms during this visit. No pain is reported.  He does not smoke, drink alcohol, or use drugs. 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  His diet occasionally includes organ meats, but no specific dietary restrictions or issues were mentioned.  He is married and currently employed at [employer], working with cabinets. He has three living children, including two sons and one daughter. Two of his children have passed away, but details were not provided.   He experiences back and neck pain, particularly around the cervical spine, which he attributes to his work therapist, occupational. He manages the pain with over-the-counter medications like Tylenol  as needed and has not sought imaging or specific treatment.  On May 26, 2022, he was hospitalized due to overexertion during a family gathering, which he attributes to overworking himself and consuming unhealthy food. A CT angiogram of the chest was performed, and his triglycerides were elevated at that time.  No issues with urination. No known family history of diabetes, as his family resides abroad and his mother passed away when he was young. No history of  asthma or significant respiratory issues, though he occasionally experiences difficulty breathing, which he attributes to pneumonia.  He has not received the flu or COVID vaccines and is interested in the pneumonia vaccine due to past experiences of respiratory difficulty. He has not been diagnosed with asthma and has never been hospitalized for respiratory issues.  He does not regularly visit the dentist. 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 No pertinent surgical history.  Allergies[1]  Outpatient Encounter Medications as of 06/06/2024  Medication Sig   acetaminophen  (TYLENOL ) 500 MG tablet Take 2 tablets (1,000 mg total) by mouth every 8 (eight) hours as needed.   No facility-administered encounter medications on file as of 06/06/2024.    Review of Systems:  Review of Systems  Constitutional:  Negative for activity change, appetite change and fever.  HENT:  Negative for sore throat.   Eyes: Negative.   Cardiovascular:  Negative for chest pain and leg swelling.  Gastrointestinal:  Negative for abdominal distention, diarrhea and vomiting.  Genitourinary:  Negative for dysuria, frequency and urgency.  Skin:  Negative for  color change.  Neurological:  Negative for dizziness and headaches.  Psychiatric/Behavioral:  Negative for behavioral problems and sleep disturbance. The patient is not nervous/anxious.     Health Maintenance  Topic Date Due   Colonoscopy  Never done   Influenza Vaccine  08/23/2024 (Originally 12/25/2023)   Zoster Vaccines- Shingrix (1 of 2) 09/04/2024 (Originally 10/07/2008)   DTaP/Tdap/Td (2 -  Td or Tdap) 06/06/2025 (Originally 12/02/2022)   Pneumococcal Vaccine: 50+ Years  Completed   Hepatitis C Screening  Completed   HIV Screening  Completed   Hepatitis B Vaccines 19-59 Average Risk  Aged Out   Meningococcal B Vaccine  Aged Out   COVID-19 Vaccine  Discontinued    Physical Exam: Vitals:   06/06/24 0838  BP: 134/78  Pulse: 67  Temp: (!) 97.5 F (36.4 C)  TempSrc: Temporal  SpO2: 98%  Weight: 128 lb 9.6 oz (58.3 kg)  Height: 5' 1.02 (1.55 m)   Body mass index is 24.28 kg/m. Physical Exam Constitutional:      Appearance: Normal appearance.  HENT:     Head: Normocephalic and atraumatic.     Mouth/Throat:     Mouth: Mucous membranes are moist.  Eyes:     Conjunctiva/sclera: Conjunctivae normal.  Cardiovascular:     Rate and Rhythm: Normal rate and regular rhythm.     Pulses: Normal pulses.     Heart sounds: Normal heart sounds.  Pulmonary:     Effort: Pulmonary effort is normal.     Breath sounds: Normal breath sounds.  Abdominal:     General: Bowel sounds are normal.     Palpations: Abdomen is soft.  Musculoskeletal:        General: No swelling. Normal range of motion.     Cervical back: Normal range of motion.  Skin:    General: Skin is warm and dry.  Neurological:     General: No focal deficit present.     Mental Status: He is alert and oriented to person, place, and time.  Psychiatric:        Mood and Affect: Mood normal.        Behavior: Behavior normal.        Thought Content: Thought content normal.        Judgment: Judgment normal.     Labs reviewed: Basic Metabolic Panel: No results for input(s): NA, K, CL, CO2, GLUCOSE, BUN, CREATININE, CALCIUM, MG, PHOS, TSH in the last 8760 hours. Liver Function Tests: No results for input(s): AST, ALT, ALKPHOS, BILITOT, PROT, ALBUMIN in the last 8760 hours. No results for input(s): LIPASE, AMYLASE in the last 8760 hours. No results for input(s): AMMONIA in the  last 8760 hours. CBC: No results for input(s): WBC, NEUTROABS, HGB, HCT, MCV, PLT in the last 8760 hours. Lipid Panel: No results for input(s): CHOL, HDL, LDLCALC, TRIG, CHOLHDL, LDLDIRECT in the last 8760 hours. Lab Results  Component Value Date   HGBA1C (L) 05/01/2010    4.2 (NOTE)                                                                       According to the ADA Clinical Practice Recommendations for 2011, when HbA1c is used as a screening test:   >=6.5%  Diagnostic of Diabetes Mellitus           (if abnormal result  is confirmed)  5.7-6.4%   Increased risk of developing Diabetes Mellitus  References:Diagnosis and Classification of Diabetes Mellitus,Diabetes Care,2011,34(Suppl 1):S62-S69 and Standards of Medical Care in         Diabetes - 2011,Diabetes Care,2011,34  (Suppl 1):S11-S61.    Procedures since last visit: No results found.  Assessment/Plan  1. Neck pain (Primary) -  works in [employer] and looks down a lot -  able to move neck without difficulty -  will start on Acetaminophen  - acetaminophen  (TYLENOL ) 500 MG tablet; Take 2 tablets (1,000 mg total) by mouth every 8 (eight) hours as needed.  2. Screening for colorectal cancer - Ambulatory referral to Gastroenterology  3. Screening for prostate cancer - PSA  4. Screen for STD (sexually transmitted disease) - Hep C Antibody - HIV Antibody (routine testing w rflx)  5. Hyperlipidemia, unspecified hyperlipidemia type -  not on medications - CBC with Differential/Platelets - Comprehensive metabolic panel - Lipid Panel  6. Encounter for preventive care - CBC with Differential/Platelets - Comprehensive metabolic panel - Hemoglobin A1c  7. Encounter to establish care -  established care with [facility]     Labs/tests ordered:   CBC, A1C, lipid panel, HIV and hep C antibody, PSA   Return in about 2 weeks (around 06/20/2024).  Zavion Sleight Medina-Vargas, NP      [1] No Known  Allergies  "

## 2024-06-07 LAB — LIPID PANEL
Cholesterol: 100 mg/dL
HDL: 37 mg/dL — ABNORMAL LOW
LDL Cholesterol (Calc): 43 mg/dL
Non-HDL Cholesterol (Calc): 63 mg/dL
Total CHOL/HDL Ratio: 2.7 (calc)
Triglycerides: 122 mg/dL

## 2024-06-07 LAB — CBC WITH DIFFERENTIAL/PLATELET
Absolute Lymphocytes: 814 {cells}/uL — ABNORMAL LOW (ref 850–3900)
Absolute Monocytes: 340 {cells}/uL (ref 200–950)
Basophils Absolute: 18 {cells}/uL (ref 0–200)
Basophils Relative: 0.4 %
Eosinophils Absolute: 32 {cells}/uL (ref 15–500)
Eosinophils Relative: 0.7 %
HCT: 40.7 % (ref 39.4–51.1)
Hemoglobin: 11.7 g/dL — ABNORMAL LOW (ref 13.2–17.1)
MCH: 22.6 pg — ABNORMAL LOW (ref 27.0–33.0)
MCHC: 28.7 g/dL — ABNORMAL LOW (ref 31.6–35.4)
MCV: 78.6 fL — ABNORMAL LOW (ref 81.4–101.7)
MPV: 12.9 fL — ABNORMAL HIGH (ref 7.5–12.5)
Monocytes Relative: 7.4 %
Neutro Abs: 3395 {cells}/uL (ref 1500–7800)
Neutrophils Relative %: 73.8 %
Platelets: 211 Thousand/uL (ref 140–400)
RBC: 5.18 Million/uL (ref 4.20–5.80)
RDW: 16.3 % — ABNORMAL HIGH (ref 11.0–15.0)
Total Lymphocyte: 17.7 %
WBC: 4.6 Thousand/uL (ref 3.8–10.8)

## 2024-06-07 LAB — COMPREHENSIVE METABOLIC PANEL WITH GFR
AG Ratio: 1.8 (calc) (ref 1.0–2.5)
ALT: 19 U/L (ref 9–46)
AST: 17 U/L (ref 10–35)
Albumin: 4.6 g/dL (ref 3.6–5.1)
Alkaline phosphatase (APISO): 77 U/L (ref 35–144)
BUN/Creatinine Ratio: 23 (calc) — ABNORMAL HIGH (ref 6–22)
BUN: 14 mg/dL (ref 7–25)
CO2: 26 mmol/L (ref 20–32)
Calcium: 9.2 mg/dL (ref 8.6–10.3)
Chloride: 106 mmol/L (ref 98–110)
Creat: 0.6 mg/dL — ABNORMAL LOW (ref 0.70–1.35)
Globulin: 2.5 g/dL (ref 1.9–3.7)
Glucose, Bld: 79 mg/dL (ref 65–99)
Potassium: 3.9 mmol/L (ref 3.5–5.3)
Sodium: 140 mmol/L (ref 135–146)
Total Bilirubin: 2 mg/dL — ABNORMAL HIGH (ref 0.2–1.2)
Total Protein: 7.1 g/dL (ref 6.1–8.1)
eGFR: 107 mL/min/1.73m2

## 2024-06-07 LAB — HEPATITIS C ANTIBODY: Hepatitis C Ab: NONREACTIVE

## 2024-06-07 LAB — HIV ANTIBODY (ROUTINE TESTING W REFLEX)
HIV 1&2 Ab, 4th Generation: NONREACTIVE
HIV FINAL INTERPRETATION: NEGATIVE

## 2024-06-07 LAB — HEMOGLOBIN A1C: Hgb A1c MFr Bld: <4.2 %

## 2024-06-07 LAB — PSA: PSA: 0.49 ng/mL

## 2024-06-08 ENCOUNTER — Emergency Department (HOSPITAL_COMMUNITY)

## 2024-06-08 ENCOUNTER — Emergency Department (HOSPITAL_COMMUNITY)
Admission: EM | Admit: 2024-06-08 | Discharge: 2024-06-08 | Disposition: A | Discharge status: Home / Self Care | Attending: Emergency Medicine | Admitting: Emergency Medicine

## 2024-06-08 ENCOUNTER — Other Ambulatory Visit: Payer: Self-pay

## 2024-06-08 DIAGNOSIS — R110 Nausea: Secondary | ICD-10-CM | POA: Insufficient documentation

## 2024-06-08 DIAGNOSIS — R1013 Epigastric pain: Secondary | ICD-10-CM | POA: Insufficient documentation

## 2024-06-08 DIAGNOSIS — R7989 Other specified abnormal findings of blood chemistry: Secondary | ICD-10-CM | POA: Insufficient documentation

## 2024-06-08 DIAGNOSIS — R748 Abnormal levels of other serum enzymes: Secondary | ICD-10-CM

## 2024-06-08 DIAGNOSIS — R1011 Right upper quadrant pain: Secondary | ICD-10-CM | POA: Insufficient documentation

## 2024-06-08 DIAGNOSIS — R7402 Elevation of levels of lactic acid dehydrogenase (LDH): Secondary | ICD-10-CM | POA: Insufficient documentation

## 2024-06-08 DIAGNOSIS — R17 Unspecified jaundice: Secondary | ICD-10-CM

## 2024-06-08 LAB — CBC WITH DIFFERENTIAL/PLATELET
Abs Immature Granulocytes: 0.04 K/uL (ref 0.00–0.07)
Basophils Absolute: 0 K/uL (ref 0.0–0.1)
Basophils Relative: 0 %
Eosinophils Absolute: 0 K/uL (ref 0.0–0.5)
Eosinophils Relative: 0 %
HCT: 38.5 % — ABNORMAL LOW (ref 39.0–52.0)
Hemoglobin: 11.8 g/dL — ABNORMAL LOW (ref 13.0–17.0)
Immature Granulocytes: 1 %
Lymphocytes Relative: 8 %
Lymphs Abs: 0.5 K/uL — ABNORMAL LOW (ref 0.7–4.0)
MCH: 23.1 pg — ABNORMAL LOW (ref 26.0–34.0)
MCHC: 30.6 g/dL (ref 30.0–36.0)
MCV: 75.5 fL — ABNORMAL LOW (ref 80.0–100.0)
Monocytes Absolute: 0.4 K/uL (ref 0.1–1.0)
Monocytes Relative: 6 %
Neutro Abs: 5.7 K/uL (ref 1.7–7.7)
Neutrophils Relative %: 85 %
Platelets: 153 K/uL (ref 150–400)
RBC: 5.1 MIL/uL (ref 4.22–5.81)
RDW: 15.5 % (ref 11.5–15.5)
WBC: 6.7 K/uL (ref 4.0–10.5)
nRBC: 0 % (ref 0.0–0.2)

## 2024-06-08 LAB — COMPREHENSIVE METABOLIC PANEL WITH GFR
ALT: 30 U/L (ref 0–44)
AST: 37 U/L (ref 15–41)
Albumin: 4.7 g/dL (ref 3.5–5.0)
Alkaline Phosphatase: 81 U/L (ref 38–126)
Anion gap: 18 — ABNORMAL HIGH (ref 5–15)
BUN: 20 mg/dL (ref 8–23)
CO2: 18 mmol/L — ABNORMAL LOW (ref 22–32)
Calcium: 9.8 mg/dL (ref 8.9–10.3)
Chloride: 100 mmol/L (ref 98–111)
Creatinine, Ser: 1 mg/dL (ref 0.61–1.24)
GFR, Estimated: >60 mL/min
Glucose, Bld: 110 mg/dL — ABNORMAL HIGH (ref 70–99)
Potassium: 3.9 mmol/L (ref 3.5–5.1)
Sodium: 136 mmol/L (ref 135–145)
Total Bilirubin: 3.3 mg/dL — ABNORMAL HIGH (ref 0.0–1.2)
Total Protein: 7.8 g/dL (ref 6.5–8.1)

## 2024-06-08 LAB — URINALYSIS, ROUTINE W REFLEX MICROSCOPIC
Bilirubin Urine: NEGATIVE
Glucose, UA: NEGATIVE mg/dL
Hgb urine dipstick: NEGATIVE
Ketones, ur: 20 mg/dL — AB
Leukocytes,Ua: NEGATIVE
Nitrite: NEGATIVE
Protein, ur: NEGATIVE mg/dL
Specific Gravity, Urine: 1.018 (ref 1.005–1.030)
pH: 8 (ref 5.0–8.0)

## 2024-06-08 LAB — I-STAT CG4 LACTIC ACID, ED
Lactic Acid, Venous: 3.4 mmol/L (ref 0.5–1.9)
Lactic Acid, Venous: 3 mmol/L (ref 0.5–1.9)

## 2024-06-08 LAB — RESP PANEL BY RT-PCR (RSV, FLU A&B, COVID)  RVPGX2
Influenza A by PCR: NEGATIVE
Influenza B by PCR: NEGATIVE
Resp Syncytial Virus by PCR: NEGATIVE
SARS Coronavirus 2 by RT PCR: NEGATIVE

## 2024-06-08 LAB — CK: Total CK: 125 U/L (ref 49–397)

## 2024-06-08 LAB — I-STAT CHEM 8, ED
BUN: 22 mg/dL (ref 8–23)
Calcium, Ion: 1.09 mmol/L — ABNORMAL LOW (ref 1.15–1.40)
Chloride: 103 mmol/L (ref 98–111)
Creatinine, Ser: 0.9 mg/dL (ref 0.61–1.24)
Glucose, Bld: 119 mg/dL — ABNORMAL HIGH (ref 70–99)
HCT: 39 % (ref 39.0–52.0)
Hemoglobin: 13.3 g/dL (ref 13.0–17.0)
Potassium: 3.7 mmol/L (ref 3.5–5.1)
Sodium: 137 mmol/L (ref 135–145)
TCO2: 19 mmol/L — ABNORMAL LOW (ref 22–32)

## 2024-06-08 LAB — LIPASE, BLOOD: Lipase: 58 U/L — ABNORMAL HIGH (ref 11–51)

## 2024-06-08 LAB — TROPONIN T, HIGH SENSITIVITY
Troponin T High Sensitivity: <15 ng/L (ref 0–19)
Troponin T High Sensitivity: <15 ng/L (ref 0–19)

## 2024-06-08 MED ORDER — LACTATED RINGERS IV BOLUS
1000.0000 mL | Freq: Once | INTRAVENOUS | Status: AC
  Administered 2024-06-08: 1000 mL via INTRAVENOUS

## 2024-06-08 MED ORDER — ONDANSETRON HCL 4 MG/2ML IJ SOLN
4.0000 mg | Freq: Once | INTRAMUSCULAR | Status: AC
  Administered 2024-06-08: 4 mg via INTRAVENOUS
  Filled 2024-06-08: qty 2

## 2024-06-08 MED ORDER — ONDANSETRON 4 MG PO TBDP: 4.0000 mg | ORAL_TABLET | Freq: Three times a day (TID) | ORAL | 0 refills | Status: AC | PRN

## 2024-06-08 MED ORDER — IOHEXOL 350 MG/ML SOLN
100.0000 mL | Freq: Once | INTRAVENOUS | Status: AC | PRN
  Administered 2024-06-08: 100 mL via INTRAVENOUS

## 2024-06-08 MED ORDER — PANTOPRAZOLE SODIUM 20 MG PO TBEC: 20.0000 mg | DELAYED_RELEASE_TABLET | Freq: Every day | ORAL | 0 refills | Status: AC

## 2024-06-08 MED ORDER — PANTOPRAZOLE SODIUM 40 MG IV SOLR
40.0000 mg | Freq: Once | INTRAVENOUS | Status: AC
  Administered 2024-06-08: 40 mg via INTRAVENOUS
  Filled 2024-06-08: qty 10

## 2024-06-08 NOTE — ED Provider Notes (Signed)
 " [place] EMERGENCY DEPARTMENT AT [facility] Provider Note   CSN: [id number] Arrival date & time: 06/08/24  1720     Patient presents with: Generalized Body Aches   Kyle Cooke is a 64 y.o. male.   64 year old male presenting with multiple complaints.  Patient is companied by his family members who act as interpreters.  Patient endorses 1 day of generalized body aches all over, he also endorses nausea with some epigastric and right upper quadrant discomfort.  Denies vomiting, diarrhea, fever.  Patient's daughter reports that he has had a workup with cardiology in the past and was told that what he believed was chest pain is likely more of a gastrointestinal issue, it does not sound as if he has been seen/evaluated by GI.  Endorses new shortness of breath that began today as well, no chest pain.  No groin pain.        Prior to Admission medications  Medication Sig Start Date End Date Taking? Authorizing Provider  acetaminophen  (TYLENOL ) 500 MG tablet Take 2 tablets (1,000 mg total) by mouth every 8 (eight) hours as needed. 06/06/24   Medina-Vargas, Monina C, NP    Allergies: Patient has no known allergies.    Review of Systems  Updated Vital Signs  Vitals:   06/08/24 1731 06/08/24 1736 06/08/24 1830  BP:  (!) 149/81 130/79  Pulse:  92 85  Resp:  (!) 28 (!) 22  Temp:  98.1 F (36.7 C)   SpO2:  100% 100%  Weight: 58.3 kg    Height: 5' 1 (1.549 m)       Physical Exam Vitals and nursing note reviewed.  HENT:     Head: Normocephalic.  Eyes:     Extraocular Movements: Extraocular movements intact.     Pupils: Pupils are equal, round, and reactive to light.  Cardiovascular:     Rate and Rhythm: Normal rate and regular rhythm.     Heart sounds: Normal heart sounds.  Pulmonary:     Effort: No respiratory distress (Speaking in full/clear sentences).     Breath sounds: Normal breath sounds.     Comments: Somewhat tachypneic Abdominal:     Palpations: Abdomen  is soft.     Tenderness: There is abdominal tenderness (Epigastric/right upper quadrant, negative Murphy's). There is no guarding.  Musculoskeletal:     Cervical back: Normal range of motion.     Comments: Moves all extremities spontaneously without difficulty  Skin:    General: Skin is warm and dry.  Neurological:     General: No focal deficit present.     Mental Status: He is alert and oriented to person, place, and time.     (all labs ordered are listed, but only abnormal results are displayed) Labs Reviewed  COMPREHENSIVE METABOLIC PANEL WITH GFR - Abnormal; Notable for the following components:      Result Value   CO2 18 (*)    Glucose, Bld 110 (*)    Total Bilirubin 3.3 (*)    Anion gap 18 (*)    All other components within normal limits  CBC WITH DIFFERENTIAL/PLATELET - Abnormal; Notable for the following components:   Hemoglobin 11.8 (*)    HCT 38.5 (*)    MCV 75.5 (*)    MCH 23.1 (*)    Lymphs Abs 0.5 (*)    All other components within normal limits  LIPASE, BLOOD - Abnormal; Notable for the following components:   Lipase 58 (*)    All other  components within normal limits  URINALYSIS, ROUTINE W REFLEX MICROSCOPIC - Abnormal; Notable for the following components:   Ketones, ur 20 (*)    All other components within normal limits  I-STAT CHEM 8, ED - Abnormal; Notable for the following components:   Glucose, Bld 119 (*)    Calcium, Ion 1.09 (*)    TCO2 19 (*)    All other components within normal limits  I-STAT CG4 LACTIC ACID, ED - Abnormal; Notable for the following components:   Lactic Acid, Venous 3.4 (*)    All other components within normal limits  I-STAT CG4 LACTIC ACID, ED - Abnormal; Notable for the following components:   Lactic Acid, Venous 3.0 (*)    All other components within normal limits  RESP PANEL BY RT-PCR (RSV, FLU A&B, COVID)  RVPGX2  CK  TROPONIN T, HIGH SENSITIVITY  TROPONIN T, HIGH SENSITIVITY    EKG: EKG  Interpretation Date/Time:  Wednesday June 08 2024 18:00:26 EST Ventricular Rate:  86 PR Interval:  156 QRS Duration:  75 QT Interval:  364 QTC Calculation: 436 R Axis:   78  Text Interpretation: Sinus rhythm when compared to prior, similar appearance No STEMI Confirmed by Ginger Barefoot ([id number]) on 06/08/2024 6:38:49 PM  Radiology: US  Abdomen Limited RUQ (LIVER/GB) Result Date: 06/08/2024 EXAM: Right Upper Quadrant Abdominal Ultrasound 06/08/2024 10:00:00 PM TECHNIQUE: Real-time ultrasonography of the right upper quadrant of the abdomen was performed. COMPARISON: ct abd and pelvis 06/08/24 CLINICAL HISTORY: Epigastric pain. Elevated t bilirubin. FINDINGS: LIVER: Normal echogenicity. No intrahepatic biliary ductal dilatation. No evidence of mass. Hepatopetal flow in the portal vein. BILIARY SYSTEM: No pericholecystic fluid or wall thickening. No cholelithiasis. Common bile duct is within normal limits measuring 3 mm. RIGHT KIDNEY: No hydronephrosis. No echogenic calculi. No mass. PANCREAS: Visualized portions of the pancreas are unremarkable. OTHER: No right upper quadrant ascites. IMPRESSION: 1. No acute findings. Electronically signed by: Morgane Naveau MD 06/08/2024 10:07 PM EST RP Workstation: [id number]   CT Angio Chest/Abd/Pel for Dissection W and/or Wo Contrast Result Date: 06/08/2024 EXAM: CTA CHEST 06/08/2024 07:21:35 PM TECHNIQUE: CTA of the chest was performed without and with the administration of 100 mL of iohexol  (OMNIPAQUE ) 350 MG/ML injection. Multiplanar reformatted images are provided for review. MIP images are provided for review. Automated exposure control, iterative reconstruction, and/or weight based adjustment of the mA/kV was utilized to reduce the radiation dose to as low as reasonably achievable. COMPARISON: None available. CLINICAL HISTORY: Chest pain, severely uncomfortable, concerns for dissection. FINDINGS: PULMONARY ARTERIES: Pulmonary arteries are adequately opacified  for evaluation. No acute pulmonary embolus. Main pulmonary artery is normal in caliber. MEDIASTINUM: The heart and pericardium demonstrate no acute abnormality. No significant coronary artery calcification. There is no acute abnormality of the thoracic aorta. No intramural hematoma, dissection, or aortic aneurysm. No significant atherosclerotic calcification within the thoracoabdominal aorta. LYMPH NODES: No mediastinal, hilar or axillary lymphadenopathy. LUNGS AND PLEURA: Bronchial wall thickening noted in keeping with airway inflammation. No focal consolidation or pulmonary edema. No evidence of pleural effusion or pneumothorax. UPPER ABDOMEN: Mild splenomegaly with a spleen measuring up to 14.8 cm in greatest dimension. No definite intrasplenic lesion identified. Simple cortical cyst within the left kidney for which no follow up imaging is recommended. Shallow bilateral direct inguinal hernias with herniation of the left anteroinferior bladder wall. Appendix normal. The stomach, small bowel, and large bowel are otherwise unremarkable. SOFT TISSUES AND BONES: T12 vertebral hemangioma. Osseous structures are age appropriate. No acute bone abnormality. No  lytic or blastic bone lesion. No acute soft tissue abnormality. IMPRESSION: 1. No pulmonary embolism or aortic dissection. 2. Bronchial wall thickening in keeping with airway inflammation. 3. Shallow bilateral direct inguinal hernias with herniation of the left anteroinferior bladder wall. 4. Mild splenomegaly, measuring up to 14.8 cm, without definite intrasplenic lesion. Electronically signed by: Dorethia Molt MD 06/08/2024 07:32 PM EST RP Workstation: [id number]     Procedures   Medications Ordered in the ED  iohexol  (OMNIPAQUE ) 350 MG/ML injection 100 mL (100 mLs Intravenous Contrast Given 06/08/24 1913)  ondansetron  (ZOFRAN ) injection 4 mg (4 mg Intravenous Given 06/08/24 2026)  pantoprazole  (PROTONIX ) injection 40 mg (40 mg Intravenous Given 06/08/24  2026)  lactated ringers  bolus 1,000 mL (0 mLs Intravenous Stopped 06/08/24 2314)                                    Medical Decision Making This patient presents to the ED for concern of multiple complaints, this involves an extensive number of treatment options, and is a complaint that carries with it a high risk of complications and morbidity.  The differential diagnosis includes COVID/flu/RSV, other viral illness, rhabdomyolysis, pancreatitis, gallbladder pathology, aortic dissection   Additional history obtained:  Additional history obtained from record review External records from outside source obtained and reviewed including prior cardiology note   Lab Tests:  I Ordered, and personally interpreted labs.  The pertinent results include: CBC notable for hemoglobin of 11.8 that is consistent with most recent baseline.  CMP notable for anion gap of 18 with total bilirubin elevation at 3.3.  Lipase elevated at 58, this has been elevated similarly in the past. Initial troponin < 15, repeat remains unchanged.  CK normal.  Respiratory viral panel negative.  Lactic elevated at 3.4 and 3.0 respectively.  Urinalysis notable for 20 ketones, otherwise unremarkable.   Imaging Studies ordered:  I ordered imaging studies including RUQ US ,  CT dissection study I independently visualized and interpreted imaging which showed - RUQ US : 1. No acute findings. - CT dissection study: 1. No pulmonary embolism or aortic dissection. 2. Bronchial wall thickening in keeping with airway inflammation. 3. Shallow bilateral direct inguinal hernias with herniation of the left anteroinferior bladder wall. 4. Mild splenomegaly, measuring up to 14.8 cm, without definite intrasplenic lesion.  I agree with the radiologist interpretation   Cardiac Monitoring: / EKG:  The patient was maintained on a cardiac monitor.  I personally viewed and interpreted the cardiac monitored which showed an underlying rhythm of:  NSR  Problem List / ED Course / Critical interventions / Medication management  I ordered medication including Zofran  and Protonix  for nausea/epigastric pain, LR bolus for suspected dehydration Reevaluation of the patient after these medicines showed that the patient improved I have reviewed the patients home medicines and have made adjustments as needed  Test / Admission - Considered:  [place] interpreter service offered, however patient and his daughter prefer that she translate for the patient. Physical exam is notable as above, patient is mildly tachypneic but is speaking in full/clear sentences without evidence of respiratory distress.  He does endorse epigastric discomfort as well as nausea, initially he complained of chest pain however I feel that this may have been an error due to the language barrier, when I asked patient to point to where he is experiencing pain he points to the epigastric region, when I point to his chest he denies chest  pain.  Patient's daughter reports that he has been seen/evaluated by cardiology before, at that time they felt that his epigastric discomfort was likely due to a gastrointestinal issue, patient's daughter reports that he has never had a colonoscopy or endoscopy but is on the list to see GI, however she notes that she has not heard from gastroenterology in regard to establishing an appointment. Labs are notable as above, troponin < 15 x 2, low suspicion for ACS based on this as well as reassuring dissection study.  CMP is notable for anion gap of 18 with total bilirubin of 3.3, total bilirubin has been elevated in the past but not to this extent.  Lipase is also minimally elevated at 58, which it has been similarly elevated in the past.  Lactic is elevated as well, although clinically patient does not meet any other criteria for SIRS/sepsis.  Respiratory panel negative. Given epigastric pain with associated nausea, will evaluate further for underlying  gallbladder pathology with right upper quadrant ultrasound. Right upper quadrant ultrasound was negative as above.  CT imaging does not show any evidence of pulmonary embolism or aortic dissection, there is bronchial wall thickening suggestive of airway inflammation, I suspect that this is chronic given that patient is not having any respiratory symptoms.  Also notable for inguinal hernias bilaterally, patient is not having any groin pain or complaint at this time.  Splenomegaly noted as well. Etiology of patient's elevated lactic/total bilirubin/lipase is unclear, imaging results do not clarify any underlying cause for these abnormal labs, patient does not appear to have any ongoing infectious process, his viral panel is negative.  He did note some improvement in his symptoms after administration of Protonix  and Zofran  as well as IV fluid bolus for suspected dehydration, after an extended discussion with his daughter I do feel that a gastroenterology referral is warranted given that he has had recurrent bouts of epigastric discomfort with associated nausea.  He is also significantly overdue for a colonoscopy, as he is 64 years old and has never had 1.  Will start patient on Protonix  as well as Zofran .  Strict return precautions discussed, patient and his family members voiced understanding and are in agreement this plan, he is appropriate for discharge at this time. Ambulated on pulse oximetry with nursing staff, O2 sats remained at 100%, patient endorsed nausea but no other complaints.   Staffed with Dr. Ginger   Amount and/or Complexity of Data Reviewed Radiology: ordered.  Risk Prescription drug management.        Final diagnoses:  Epigastric pain  Serum total bilirubin elevated  Elevated lipase  Nausea  Elevated lactic acid level    ED Discharge Orders          Ordered    Ambulatory referral to Gastroenterology        06/08/24 2308    pantoprazole  (PROTONIX ) 20 MG tablet  Daily         06/08/24 2308    ondansetron  (ZOFRAN -ODT) 4 MG disintegrating tablet  Every 8 hours PRN        06/08/24 2308               Glendia Rocky SAILOR, PA-C 06/08/24 2332    Tegeler, Lonni PARAS, MD 06/08/24 2349  "

## 2024-06-08 NOTE — ED Notes (Signed)
 Patient provided urinal for specimen collection

## 2024-06-08 NOTE — ED Triage Notes (Signed)
 Pt to er, family on phone helping to interpret for pt, states that pt has been here for the same in the past, states that pt is here for muscle spasms, pt has elevated RR.

## 2024-06-08 NOTE — ED Provider Triage Note (Signed)
 Emergency Medicine Provider Triage Evaluation Note  Kyle Cooke , a 64 y.o. male  was evaluated in triage.  Pt complains of diffuse bodyaches and muscle spasms that began this afternoon.  Patient notes he was resting and then woke up and began having severe pain all over the body.  Daughter on the phone is translating.  States has had issues with muscle pain like this before.  Patient states he is having chest pain and difficulty breathing.  He cannot pinpoint pain otherwise.  Notes pain all over.  History of diabetes.  Review of Systems  Positive: Body pain, chest pain Negative: Recent illness, fevers, headache  Physical Exam  BP (!) 149/81 (BP Location: Left Arm)   Pulse 92   Temp 98.1 F (36.7 C)   Resp (!) 28   Ht 5' 1 (1.549 m)   Wt 58.3 kg   SpO2 100%   BMI 24.29 kg/m  Gen:   Awake, no distress   Resp:  Normal effort  MSK:   Moves extremities without difficulty  Other:  Extremely uncomfortable and tachypneic sitting in the chair  Medical Decision Making  Medically screening exam initiated at 5:36 PM.  Appropriate orders placed.  Kyle Cooke was informed that the remainder of the evaluation will be completed by another provider, this initial triage assessment does not replace that evaluation, and the importance of remaining in the ED until their evaluation is complete.     Neysa Thersia RAMAN, [place] 06/08/24 1738

## 2024-06-08 NOTE — Discharge Instructions (Addendum)
 Your epigastric pain and nausea may be due to a gastrointestinal issue, like gastritis or GERD.  Start Protonix  and take 1 tablet by mouth daily. For nausea or vomiting, you may use Zofran  every 8 hours as needed. I have provided you with a referral to [facility] gastroenterology, their office should be in contact with you to schedule a follow-up, however if you do not hear from them please contact them at the number provided above. Your lab results today showed some abnormalities like elevations in your lipase, total bilirubin, and lactic acid levels.  Please discuss this with your primary care provider. Return to the emergency department if your symptoms worsen.

## 2024-06-10 ENCOUNTER — Ambulatory Visit: Payer: Self-pay | Admitting: Adult Health

## 2024-06-10 NOTE — Progress Notes (Signed)
-   Hemoglobin 11.7, stable -   Total bilirubin 2.0, has been consistently slightly elevated for 2 years, ultrasound of the abdomen done on 06/08/2024 showed normal liver, will continue to monitor, follow up with gastroenterology - Cholesterol, LDL, triglycerides, PSA, electrolytes, all normal -  continue current medications

## 2024-06-30 ENCOUNTER — Encounter: Payer: Self-pay | Admitting: Physician Assistant

## 2024-07-01 ENCOUNTER — Encounter: Admitting: Adult Health

## 2024-07-01 NOTE — Progress Notes (Signed)
 This encounter was created in error - please disregard.

## 2024-07-26 ENCOUNTER — Ambulatory Visit: Admitting: Physician Assistant
